# Patient Record
Sex: Female | Born: 1985 | Race: White | Hispanic: No | Marital: Single | Smoking: Former smoker
Health system: Southern US, Community
[De-identification: ages and names within clinical notes are randomized; demographics above are authoritative.]

## PROBLEM LIST (undated history)

## (undated) DIAGNOSIS — N939 Abnormal uterine and vaginal bleeding, unspecified: Secondary | ICD-10-CM

## (undated) DIAGNOSIS — D500 Iron deficiency anemia secondary to blood loss (chronic): Secondary | ICD-10-CM

## (undated) DIAGNOSIS — K589 Irritable bowel syndrome without diarrhea: Secondary | ICD-10-CM

## (undated) DIAGNOSIS — I2699 Other pulmonary embolism without acute cor pulmonale: Secondary | ICD-10-CM

## (undated) DIAGNOSIS — K219 Gastro-esophageal reflux disease without esophagitis: Secondary | ICD-10-CM

## (undated) DIAGNOSIS — D649 Anemia, unspecified: Secondary | ICD-10-CM

## (undated) DIAGNOSIS — E669 Obesity, unspecified: Secondary | ICD-10-CM

## (undated) DIAGNOSIS — F32A Depression, unspecified: Secondary | ICD-10-CM

## (undated) DIAGNOSIS — N926 Irregular menstruation, unspecified: Secondary | ICD-10-CM

## (undated) DIAGNOSIS — K824 Cholesterolosis of gallbladder: Secondary | ICD-10-CM

## (undated) DIAGNOSIS — F419 Anxiety disorder, unspecified: Secondary | ICD-10-CM

## (undated) DIAGNOSIS — E538 Deficiency of other specified B group vitamins: Secondary | ICD-10-CM

## (undated) DIAGNOSIS — Z7901 Long term (current) use of anticoagulants: Secondary | ICD-10-CM

## (undated) DIAGNOSIS — F329 Major depressive disorder, single episode, unspecified: Secondary | ICD-10-CM

## (undated) HISTORY — DX: Anxiety disorder, unspecified: F41.9

## (undated) HISTORY — DX: Cholesterolosis of gallbladder: K82.4

## (undated) HISTORY — DX: Irritable bowel syndrome, unspecified: K58.9

## (undated) HISTORY — PX: NO PAST SURGERIES: SHX2092

## (undated) HISTORY — DX: Major depressive disorder, single episode, unspecified: F32.9

## (undated) HISTORY — DX: Depression, unspecified: F32.A

## (undated) HISTORY — DX: Obesity, unspecified: E66.9

## (undated) HISTORY — DX: Gastro-esophageal reflux disease without esophagitis: K21.9

## (undated) HISTORY — DX: Anemia, unspecified: D64.9

---

## 2007-05-12 ENCOUNTER — Ambulatory Visit: Payer: Self-pay | Admitting: Gastroenterology

## 2007-05-12 LAB — CONVERTED CEMR LAB
Alkaline Phosphatase: 62 units/L (ref 39–117)
BUN: 6 mg/dL (ref 6–23)
Basophils Absolute: 0 10*3/uL (ref 0.0–0.1)
Bilirubin, Direct: 0.1 mg/dL (ref 0.0–0.3)
CO2: 29 meq/L (ref 19–32)
Eosinophils Absolute: 0.1 10*3/uL (ref 0.0–0.6)
GFR calc Af Amer: 161 mL/min
Glucose, Bld: 91 mg/dL (ref 70–99)
HCT: 32 % — ABNORMAL LOW (ref 36.0–46.0)
Iron: 25 ug/dL — ABNORMAL LOW (ref 42–145)
Lymphocytes Relative: 33.6 % (ref 12.0–46.0)
MCHC: 33 g/dL (ref 30.0–36.0)
MCV: 71.6 fL — ABNORMAL LOW (ref 78.0–100.0)
Neutro Abs: 2.3 10*3/uL (ref 1.4–7.7)
Neutrophils Relative %: 55.9 % (ref 43.0–77.0)
Potassium: 3.9 meq/L (ref 3.5–5.1)
RBC: 4.48 M/uL (ref 3.87–5.11)
Saturation Ratios: 5.2 % — ABNORMAL LOW (ref 20.0–50.0)
Sodium: 143 meq/L (ref 135–145)
Total Bilirubin: 0.5 mg/dL (ref 0.3–1.2)
Total Protein: 7.5 g/dL (ref 6.0–8.3)
Transferrin: 345.4 mg/dL (ref >=212.0)

## 2007-06-02 ENCOUNTER — Ambulatory Visit: Payer: Self-pay | Admitting: Gastroenterology

## 2007-08-05 DIAGNOSIS — K219 Gastro-esophageal reflux disease without esophagitis: Secondary | ICD-10-CM

## 2007-08-05 DIAGNOSIS — K589 Irritable bowel syndrome without diarrhea: Secondary | ICD-10-CM

## 2007-08-05 DIAGNOSIS — E669 Obesity, unspecified: Secondary | ICD-10-CM

## 2007-08-05 DIAGNOSIS — F418 Other specified anxiety disorders: Secondary | ICD-10-CM | POA: Insufficient documentation

## 2007-08-05 DIAGNOSIS — F411 Generalized anxiety disorder: Secondary | ICD-10-CM | POA: Insufficient documentation

## 2007-08-05 DIAGNOSIS — K828 Other specified diseases of gallbladder: Secondary | ICD-10-CM

## 2007-08-05 DIAGNOSIS — F329 Major depressive disorder, single episode, unspecified: Secondary | ICD-10-CM

## 2008-06-17 ENCOUNTER — Telehealth: Payer: Self-pay | Admitting: Gastroenterology

## 2008-07-16 ENCOUNTER — Ambulatory Visit: Payer: Self-pay | Admitting: Gastroenterology

## 2008-10-22 ENCOUNTER — Telehealth: Payer: Self-pay | Admitting: Gastroenterology

## 2008-10-23 ENCOUNTER — Encounter: Payer: Self-pay | Admitting: Gastroenterology

## 2009-07-01 ENCOUNTER — Telehealth: Payer: Self-pay | Admitting: Gastroenterology

## 2009-07-15 ENCOUNTER — Ambulatory Visit: Payer: Self-pay | Admitting: Gastroenterology

## 2009-07-29 ENCOUNTER — Ambulatory Visit: Payer: Self-pay | Admitting: Gastroenterology

## 2009-07-29 ENCOUNTER — Encounter (INDEPENDENT_AMBULATORY_CARE_PROVIDER_SITE_OTHER): Payer: Self-pay | Admitting: *Deleted

## 2010-06-11 ENCOUNTER — Ambulatory Visit: Payer: Self-pay | Admitting: Gastroenterology

## 2010-08-11 NOTE — Assessment & Plan Note (Signed)
Summary: YEARLY APPT...LSW.   History of Present Illness Visit Type: Follow-up Visit Primary GI MD: Sheryn Bison MD FACP FAGA Primary Provider: Clarita Leber Requesting Provider: n/a Chief Complaint: yearly- med refills, IBS (stress related) History of Present Illness:   Tiffany English continues with some mild depression symptomatology and is on Lexapro, BuSpar, and p.r.n. Librax for IBS. She is asymptomatic in terms of any reflux symptoms on Prevacid 30 mg a day. Her appetite is good and her weight is stable. She is having regular bowel movements without melena or hematochezia.   GI Review of Systems      Denies abdominal pain, acid reflux, belching, bloating, chest pain, dysphagia with liquids, dysphagia with solids, heartburn, loss of appetite, nausea, vomiting, vomiting blood, weight loss, and  weight gain.      Reports irritable bowel syndrome.     Denies anal fissure, black tarry stools, change in bowel habit, constipation, diarrhea, diverticulosis, fecal incontinence, heme positive stool, hemorrhoids, jaundice, light color stool, liver problems, rectal bleeding, and  rectal pain.    Current Medications (verified): 1)  Clidinium-Chlordiazepoxide 2.5-5 Mg Caps (Clidinium-Chlordiazepoxide) .... Take One By Mouth Three Times A Day 2)  Lexapro 10 Mg Tabs (Escitalopram Oxalate) .... Take 1 Tablet By Mouth Once A Day 3)  Qc Womens Daily Multivitamin  Tabs (Multiple Vitamins-Minerals) .Marland Kitchen.. 1 Once Daily 4)  Lansoprazole 30 Mg Cpdr (Lansoprazole) .Marland Kitchen.. 1 By Mouth Qd 5)  Bupropion Hcl 150 Mg Xr24h-Tab (Bupropion Hcl) .... Take 1 Tablet By Mouth Once Daily  Allergies (verified): No Known Drug Allergies  Past History:  Past medical, surgical, family and social histories (including risk factors) reviewed for relevance to current acute and chronic problems.  Past Medical History: Reviewed history from 08/05/2007 and no changes required. Current Problems:  POLYP, GALLBLADDER  (ICD-575.8) DEPRESSION (ICD-311) ANXIETY (ICD-300.00) GERD (ICD-530.81) COLORECTAL CANCER, FAMILY HX (ICD-V16.0) IRRITABLE BOWEL SYNDROME (ICD-564.1) OBESITY (ICD-278.00)  Past Surgical History: Reviewed history from 08/05/2007 and no changes required. Unremarkable  Family History: Reviewed history from 07/29/2009 and no changes required. Family History of Colon Cancer:grandfather,uncle Family History of Prostate Cancer:prostate uncle Family History of Liver Disease/Cirrhosis:great grandmother  Social History: Reviewed history from 07/29/2009 and no changes required. Patient has never smoked.  Alcohol Use - no Illicit Drug Use - no Daily Caffeine Use sodas and coffee Occupation: Student  Review of Systems       The patient complains of anxiety-new, depression-new, and sleeping problems.  The patient denies allergy/sinus, anemia, arthritis/joint pain, back pain, blood in urine, breast changes/lumps, change in vision, confusion, cough, coughing up blood, fainting, fatigue, fever, headaches-new, hearing problems, heart murmur, heart rhythm changes, itching, menstrual pain, muscle pains/cramps, night sweats, nosebleeds, pregnancy symptoms, shortness of breath, skin rash, sore throat, swelling of feet/legs, swollen lymph glands, thirst - excessive , urination - excessive , urination changes/pain, urine leakage, vision changes, and voice change.    Vital Signs:  Patient profile:   25 year old female Height:      74 inches Weight:      208.50 pounds BMI:     26.87 Pulse rate:   72 / minute Pulse rhythm:   regular BP sitting:   120 / 78  (left arm) Cuff size:   regular  Vitals Entered By: June McMurray CMA Duncan Dull) (June 11, 2010 10:51 AM)  Physical Exam  General:  Well developed, well nourished, no acute distress.healthy appearing.   Head:  Normocephalic and atraumatic. Eyes:  PERRLA, no icterus.exam deferred to patient's ophthalmologist.  Lungs:  Clear throughout to  auscultation. Heart:  Regular rate and rhythm; no murmurs, rubs,  or bruits. Abdomen:  Soft, nontender and nondistended. No masses, hepatosplenomegaly or hernias noted. Normal bowel sounds. Extremities:  No clubbing, cyanosis, edema or deformities noted. Neurologic:  Alert and  oriented x4;  grossly normal neurologically. Psych:  Alert and cooperative. Normal mood and affect.   Impression & Recommendations:  Problem # 1:  GERD (ICD-530.81) Assessment Improved Continue antireflux maneuvers and daily Prevacid therapy.  Problem # 2:  DEPRESSION (ICD-311) Assessment: Improved continue antidepressant medication per primary care  Problem # 3:  COLORECTAL CANCER, FAMILY HX (ICD-V16.0) Assessment: Unchanged colonoscopy as per clinical protocol.Her family history is not warrant colonoscopy at this time. I would probably recommend screening at age 13.  Patient Instructions: 1)  Copy sent to : Thea Gist, MD 2)  Your prescription(s) have been sent to you pharmacy.  3)  The medication list was reviewed and reconciled.  All changed / newly prescribed medications were explained.  A complete medication list was provided to the patient / caregiver. 4)  Please schedule a follow-up appointment in 1 year. 5)  Avoid foods high in acid content ( tomatoes, citrus juices, spicy foods) . Avoid eating within 3 to 4 hours of lying down or before exercising. Do not over eat; try smaller more frequent meals. Elevate head of bed four inches when sleeping.  6)  GI Reflux and Hiatal Hernia brochure given.  7)  IBS brochure given.  Prescriptions: LANSOPRAZOLE 30 MG CPDR (LANSOPRAZOLE) 1 by mouth qd  #30 x 11   Entered by:   Harlow Mares CMA (AAMA)   Authorized by:   Mardella Layman MD [facility]   Signed by:   Harlow Mares CMA (AAMA) on 06/11/2010   Method used:   Electronically to        [id number] (retail)       [address]       [place], [place]  [address]       Ph: [phone] or [phone]        Fax: [phone]   RxID:   [id number] CLIDINIUM-CHLORDIAZEPOXIDE 2.5-5 MG CAPS (CLIDINIUM-CHLORDIAZEPOXIDE) take one by mouth three times a day  #90 Capsule x 3   Entered by:   Harlow Mares CMA (AAMA)   Authorized by:   Mardella Layman MD [facility]   Signed by:   Harlow Mares CMA (AAMA) on 06/11/2010   Method used:   Electronically to        [id number] (retail)       [address]       [place], [place]  [address]       Ph: [phone] or [phone]       Fax: [phone]   RxID:   [id number]

## 2010-08-11 NOTE — Letter (Signed)
Summary: [facility] Instructions  [facility]  [address] [place], [place] [address]   Phone: [phone]  Fax: [phone]       Tiffany English    August 16, 1985    MRN: [id number]        Procedure Day /Date: Friday, 08/15/09     Arrival Time: 1:30      Procedure Time: 2:30     Location of Procedure:                    Tiffany English  [facility] (4th Floor)                        PREPARATION FOR COLONOSCOPY WITH MOVIPREP   Starting 5 days prior to your procedure 08/10/09 do not eat nuts, seeds, popcorn, corn, beans, peas,  salads, or any raw vegetables.  Do not take any fiber supplements (e.g. Metamucil, Citrucel, and Benefiber).  THE DAY BEFORE YOUR PROCEDURE         DATE: 08/14/09  DAY: Thursday  1.  Drink clear liquids the entire day-NO SOLID FOOD  2.  Do not drink anything colored red or purple.  Avoid juices with pulp.  No orange juice.  3.  Drink at least 64 oz. (8 glasses) of fluid/clear liquids during the day to prevent dehydration and help the prep work efficiently.  CLEAR LIQUIDS INCLUDE: Water Jello Ice Popsicles Tea (sugar ok, no milk/cream) Powdered fruit flavored drinks Coffee (sugar ok, no milk/cream) Gatorade Juice: apple, white grape, white cranberry  Lemonade Clear bullion, consomm, broth Carbonated beverages (any kind) Strained chicken noodle soup Hard Candy                             4.  In the morning, mix first dose of MoviPrep solution:    Empty 1 Pouch A and 1 Pouch B into the disposable container    Add lukewarm drinking water to the top line of the container. Mix to dissolve    Refrigerate (mixed solution should be used within 24 hrs)  5.  Begin drinking the prep at 5:00 p.m. The MoviPrep container is divided by 4 marks.   Every 15 minutes drink the solution down to the next mark (approximately 8 oz) until the full liter is complete.   6.  Follow completed prep with 16 oz of clear liquid of your choice (Nothing red  or purple).  Continue to drink clear liquids until bedtime.  7.  Before going to bed, mix second dose of MoviPrep solution:    Empty 1 Pouch A and 1 Pouch B into the disposable container    Add lukewarm drinking water to the top line of the container. Mix to dissolve    Refrigerate  THE DAY OF YOUR PROCEDURE      DATE: 08/15/09  DAY: Friday  Beginning at 9:30 a.m. (5 hours before procedure):         1. Every 15 minutes, drink the solution down to the next mark (approx 8 oz) until the full liter is complete.  2. Follow completed prep with 16 oz. of clear liquid of your choice.    3. You may drink clear liquids until 12:30 (2 HOURS BEFORE PROCEDURE).   MEDICATION INSTRUCTIONS  Unless otherwise instructed, you should take regular prescription medications with a small sip of water   as early as possible the morning of your  procedure.               OTHER INSTRUCTIONS  You will need a responsible adult at least 26 years of age to accompany you and drive you home.   This person must remain in the waiting room during your procedure.  Wear loose fitting clothing that is easily removed.  Leave jewelry and other valuables at home.  However, you may wish to bring a book to read or  an iPod/MP3 player to listen to music as you wait for your procedure to start.  Remove all body piercing jewelry and leave at home.  Total time from sign-in until discharge is approximately 2-3 hours.  You should go home directly after your procedure and rest.  You can resume normal activities the  day after your procedure.  The day of your procedure you should not:   Drive   Make legal decisions   Operate machinery   Drink alcohol   Return to work  You will receive specific instructions about eating, activities and medications before you leave.    The above instructions have been reviewed and explained to me by   _______________________    I fully understand and can verbalize  these instructions _____________________________ Date _________

## 2010-08-11 NOTE — Assessment & Plan Note (Signed)
Summary: RES FROM 07-15-09/YF   History of Present Illness Visit Type: Follow-up Visit Primary GI MD: Sheryn Bison MD FACP FAGA Primary Provider: Clarita Leber Requesting Provider: n/a Chief Complaint: Discuss colon screening due to family hx & med refill - chlordiazepoxide History of Present Illness:   Tiffany English is asymptomatic on regular Prevacid. She denies reflux symptoms or dysphagia. She has IBS management p.r.n. Librax. She does have chronic depression and takes Lexapro 10 mg a day and BuSpar p.r.n. She has a strong family history colon cancer with her mother having polyps at age 20, her uncle with colon cancer at age 48, and her maternal grandfather having colon cancer. She herself denies rectal bleeding or significant abdominal pain at this time. Her appetite is good her weight is stable. She denies a systemic complaints.   GI Review of Systems      Denies abdominal pain, acid reflux, belching, bloating, chest pain, dysphagia with liquids, dysphagia with solids, heartburn, loss of appetite, nausea, vomiting, vomiting blood, weight loss, and  weight gain.        Denies anal fissure, black tarry stools, change in bowel habit, constipation, diarrhea, diverticulosis, fecal incontinence, heme positive stool, hemorrhoids, irritable bowel syndrome, jaundice, light color stool, liver problems, rectal bleeding, and  rectal pain.    Current Medications (verified): 1)  Clidinium-Chlordiazepoxide 2.5-5 Mg Caps (Clidinium-Chlordiazepoxide) .... Take One By Mouth Three Times A Day 2)  Lexapro 10 Mg Tabs (Escitalopram Oxalate) .... Take 1 Tablet By Mouth Once A Day 3)  Qc Womens Daily Multivitamin  Tabs (Multiple Vitamins-Minerals) .Marland Kitchen.. 1 Once Daily 4)  Lansoprazole 30 Mg Cpdr (Lansoprazole) .Marland Kitchen.. 1 By Mouth Qd 5)  Buspirone Hcl 10 Mg Tabs (Buspirone Hcl) .... Take 1/2 Tablet By Mouth Daily  Allergies (verified): No Known Drug Allergies  Past History:  Past medical, surgical, family and  social histories (including risk factors) reviewed for relevance to current acute and chronic problems.  Past Medical History: Reviewed history from 08/05/2007 and no changes required. Current Problems:  POLYP, GALLBLADDER (ICD-575.8) DEPRESSION (ICD-311) ANXIETY (ICD-300.00) GERD (ICD-530.81) COLORECTAL CANCER, FAMILY HX (ICD-V16.0) IRRITABLE BOWEL SYNDROME (ICD-564.1) OBESITY (ICD-278.00)  Past Surgical History: Reviewed history from 08/05/2007 and no changes required. Unremarkable  Family History: Reviewed history from 07/16/2008 and no changes required. Family History of Colon Cancer:grandfather,uncle Family History of Prostate Cancer:prostate uncle Family History of Liver Disease/Cirrhosis:great grandmother  Social History: Reviewed history from 07/16/2008 and no changes required. Patient has never smoked.  Alcohol Use - no Illicit Drug Use - no Daily Caffeine Use sodas and coffee Occupation: Student  Review of Systems       The patient complains of allergy/sinus, anxiety-new, depression-new, and menstrual pain.  The patient denies arthritis/joint pain, back pain, blood in urine, breast changes/lumps, change in vision, confusion, cough, coughing up blood, fainting, fatigue, fever, headaches-new, hearing problems, heart murmur, heart rhythm changes, itching, muscle pains/cramps, night sweats, nosebleeds, pregnancy symptoms, shortness of breath, skin rash, sleeping problems, sore throat, swelling of feet/legs, swollen lymph glands, thirst - excessive , urination - excessive , urination changes/pain, urine leakage, vision changes, and voice change.    Vital Signs:  Patient profile:   25 year old female Height:      74 inches Weight:      191 pounds BMI:     24.61 Pulse rate:   60 / minute Pulse rhythm:   regular BP sitting:   114 / 68  (left arm) Cuff size:   regular  Vitals Entered By: June  McMurray CMA Duncan Dull) (July 29, 2009 3:35 PM)  Physical Exam  General:   Well developed, well nourished, no acute distress.healthy appearing.   Head:  Normocephalic and atraumatic. Eyes:  PERRLA, no icterus.exam deferred to patient's ophthalmologist.   Abdomen:  Soft, nontender and nondistended. No masses, hepatosplenomegaly or hernias noted. Normal bowel sounds. Extremities:  No clubbing, cyanosis, edema or deformities noted. Neurologic:  Alert and  oriented x4;  grossly normal neurologically. Psych:  Alert and cooperative. Normal mood and affect.   Impression & Recommendations:  Problem # 1:  COLORECTAL CANCER, FAMILY HX (ICD-V16.0) Assessment Unchanged  Outpatient colonoscopy scheduled with genetic analysis if indicated  Orders: Colonoscopy (Colon)  Problem # 2:  GERD (ICD-530.81) Assessment: Improved Continue reflux regime and daily PPI therapy.  Problem # 3:  DEPRESSION (ICD-311) Assessment: Improved Continue Lexapro 10 mg a day.  Patient Instructions: 1)  Copy sent to : Dr. Thea Gist 2)  Please continue current medications.  3)  Avoid foods high in acid content ( tomatoes, citrus juices, spicy foods) . Avoid eating within 3 to 4 hours of lying down or before exercising. Do not over eat; try smaller more frequent meals. Elevate head of bed four inches when sleeping.  4)  Colonoscopy and Flexible Sigmoidoscopy brochure given.  5)  Conscious Sedation brochure given.  6)  Daily Prevacid 30 mg and p.r.n. Librax. 7)  The medication list was reviewed and reconciled.  All changed / newly prescribed medications were explained.  A complete medication list was provided to the patient / caregiver. Prescriptions: MOVIPREP 100 GM  SOLR (PEG-KCL-NACL-NASULF-NA ASC-C) As per prep instructions.  #1 x 0   Entered by:   Ashok Cordia RN   Authorized by:   Mardella Layman MD [facility]   Signed by:   Ashok Cordia RN on 07/29/2009   Method used:   Historical   RxID:   [id number]

## 2010-11-24 NOTE — Assessment & Plan Note (Signed)
[facility]                         GASTROENTEROLOGY OFFICE NOTE   NAME:Tiffany English, Tiffany English                       MRN:          [id number]  DATE:05/12/2007                            DOB:          July 29, 1985    Tiffany English is an extremely pleasant 25 year old white female referred  by Dr. Cleda Clarks for evaluation of abdominal pain, gas, bloating, and  reflux symptoms.   HISTORY OF PRESENT ILLNESS:  Tiffany English has been in good health all of her  life, but has been morbidly obese for years, and over the last 3 years  has lost about over 150 pounds in weight with conscious diet, attention  to her Weight Watchers plan.  She has a long history of diarrhea  predominant irritable bowel syndrome with gas and bloating, and episodes  of acid reflux with burning substernal chest pain and regurgitation.  Her symptoms are managed by daily Prevacid and p.r.n. hyoscyamine.  She  has had no hepatobiliary complaints, melena, hematochezia, or other  significant gastrointestinal problems.  She denies history of hepatitis  or pancreatitis.  She has no associated dysphagia, food intolerances, or  family history of known gastrointestinal problems.  She did have an  ultrasound done recently, which showed a gallbladder polyp, but  otherwise was unremarkable, and there was no evidence of fatty liver.  She, herself, relates all of her episodes from stress at work, and she  is asymptomatic when she is away from work and on the weekends.   FAMILY HISTORY:  Remarkable for her grandfather dying his late 19s of  colon cancer, and her mother apparently has had colon polyps.  Another  grandfather had prostate cancer.  One grandmother had breast cancer.  There is atherosclerosis and diabetes in her family.   MEDICATIONS:  1. Prevacid 10 mg daily.  2. Lexapro 10 mg a day.  3. Hyoscyamine 0.125 mg p.r.n.   PAST MEDICAL HISTORY:  She has had anxiety and depression for the last 7  to 8 years.   She denies other general medical problems.   SOCIAL HISTORY:  She is single and lives with her mother.  She has a  college degree and works as a Company secretary at [employer].  She does not  smoke or use ethanol socially.  She denies problems with ethanol abuse.   REVIEW OF SYSTEMS:  Otherwise noncontributory, except for what appears  to be worsening anxiety and depression despite taking Lexapro.  She  denies suicidal thoughts, delusions, or any symptoms of psychosis.  She  specifically denies any cardiovascular, pulmonary, genitourinary,  gynecologic, endocrine, or orthopedic problems.  She is in her menstrual  period at this time.   She is an attractive, healthy-appearing white female in no distress.  She is 6 feet 2 inches tall, and weighs 204 pounds.  Blood pressure  122/80.  Pulse was 86 and regular.  I could not appreciate stigmata of chronic liver disease.  There was no thyromegaly or lymphadenopathy noted.  LUNGS:  Clear to percussion and auscultation.  She was at a regular rhythm without murmurs, gallops, or rubs.  I could not  appreciate organomegaly, abdominal masses, tenderness, or  distention.  Bowel sounds were normal.  Inspection of the rectum was unremarkable, as was rectal exam.  Stool  was guaiac negative.  Peripheral extremities were unremarkable.  Mental status was clear.   ASSESSMENT:  1. Rather classic diarrhea prominent irritable bowel syndrome.  2. History of acid reflux, doing well on Prevacid therapy.  3. Asymptomatic gallbladder polyp.  4. Family history of colon cancer and colonic polyposis.  5. History of anxiety and depression causing worsening of her      irritable bowel syndrome.   RECOMMENDATIONS:  1. Repeat laboratory data including liver function tests and sprue      panel.  2. Reflux with daily Prevacid 30 mg before breakfast.  3. Trial of Librax 1 p.o. t.i.d. before meals.  4. Low gas diet along with avoidance of sorbitol and fructose.  5. GI  followup in several weeks' time.  We will how she is doing      symptomatically, and decide if further workup is indicated.     Tiffany English. Jarold Motto, MD, Caleen Essex, FAGA  Electronically Signed    DRP/MedQ  DD: 05/12/2007  DT: 05/12/2007  Job #: [id number]   cc:   Theadora Rama, M.D.

## 2011-05-11 ENCOUNTER — Other Ambulatory Visit: Payer: Self-pay | Admitting: Gastroenterology

## 2011-05-27 ENCOUNTER — Encounter: Payer: Self-pay | Admitting: *Deleted

## 2011-05-30 ENCOUNTER — Other Ambulatory Visit: Payer: Self-pay | Admitting: Gastroenterology

## 2011-05-31 ENCOUNTER — Ambulatory Visit: Payer: Self-pay | Admitting: Gastroenterology

## 2011-07-04 ENCOUNTER — Other Ambulatory Visit: Payer: Self-pay | Admitting: Gastroenterology

## 2011-07-05 NOTE — Telephone Encounter (Signed)
NEEDS OFFICE VISIT FOR ANY FURTHER REFILLS! 

## 2011-09-07 ENCOUNTER — Other Ambulatory Visit: Payer: Self-pay | Admitting: Gastroenterology

## 2011-09-07 DIAGNOSIS — K824 Cholesterolosis of gallbladder: Secondary | ICD-10-CM

## 2011-09-07 DIAGNOSIS — R1011 Right upper quadrant pain: Secondary | ICD-10-CM

## 2011-09-08 ENCOUNTER — Other Ambulatory Visit: Payer: Self-pay

## 2011-09-13 ENCOUNTER — Inpatient Hospital Stay: Admission: RE | Admit: 2011-09-13 | Payer: Self-pay | Source: Ambulatory Visit

## 2011-09-22 ENCOUNTER — Ambulatory Visit
Admission: RE | Admit: 2011-09-22 | Discharge: 2011-09-22 | Disposition: A | Discharge status: Home / Self Care | Payer: BC Managed Care – PPO | Source: Ambulatory Visit | Attending: Gastroenterology | Admitting: Gastroenterology

## 2011-09-22 DIAGNOSIS — K824 Cholesterolosis of gallbladder: Secondary | ICD-10-CM

## 2011-09-22 DIAGNOSIS — R1011 Right upper quadrant pain: Secondary | ICD-10-CM

## 2015-07-13 HISTORY — PX: WISDOM TOOTH EXTRACTION: SHX21

## 2016-07-12 HISTORY — PX: COLONOSCOPY WITH ESOPHAGOGASTRODUODENOSCOPY (EGD): SHX5779

## 2017-04-29 ENCOUNTER — Emergency Department (HOSPITAL_COMMUNITY)
Admission: EM | Admit: 2017-04-29 | Discharge: 2017-04-30 | Disposition: A | Discharge status: Home / Self Care | Payer: Self-pay | Attending: Emergency Medicine | Admitting: Emergency Medicine

## 2017-04-29 ENCOUNTER — Encounter (HOSPITAL_COMMUNITY): Payer: Self-pay

## 2017-04-29 ENCOUNTER — Emergency Department (HOSPITAL_COMMUNITY): Payer: Self-pay

## 2017-04-29 DIAGNOSIS — W010XXA Fall on same level from slipping, tripping and stumbling without subsequent striking against object, initial encounter: Secondary | ICD-10-CM

## 2017-04-29 DIAGNOSIS — W01190A Fall on same level from slipping, tripping and stumbling with subsequent striking against furniture, initial encounter: Secondary | ICD-10-CM | POA: Insufficient documentation

## 2017-04-29 DIAGNOSIS — Y999 Unspecified external cause status: Secondary | ICD-10-CM | POA: Insufficient documentation

## 2017-04-29 DIAGNOSIS — Y92009 Unspecified place in unspecified non-institutional (private) residence as the place of occurrence of the external cause: Secondary | ICD-10-CM | POA: Insufficient documentation

## 2017-04-29 DIAGNOSIS — S01412A Laceration without foreign body of left cheek and temporomandibular area, initial encounter: Secondary | ICD-10-CM | POA: Insufficient documentation

## 2017-04-29 DIAGNOSIS — Y939 Activity, unspecified: Secondary | ICD-10-CM | POA: Insufficient documentation

## 2017-04-29 DIAGNOSIS — S0121XA Laceration without foreign body of nose, initial encounter: Secondary | ICD-10-CM | POA: Insufficient documentation

## 2017-04-29 MED ORDER — CEFAZOLIN SODIUM 1 G IJ SOLR
1.0000 g | Freq: Once | INTRAMUSCULAR | Status: AC
  Administered 2017-04-29: 1 g via INTRAMUSCULAR
  Filled 2017-04-29: qty 10

## 2017-04-29 MED ORDER — LIDOCAINE-EPINEPHRINE (PF) 2 %-1:200000 IJ SOLN
10.0000 mL | Freq: Once | INTRAMUSCULAR | Status: AC
  Administered 2017-04-29: 10 mL
  Filled 2017-04-29: qty 20

## 2017-04-29 MED ORDER — STERILE WATER FOR INJECTION IJ SOLN
INTRAMUSCULAR | Status: AC
  Administered 2017-04-29: 2.1 mL
  Filled 2017-04-29: qty 10

## 2017-04-29 NOTE — ED Provider Notes (Signed)
[facility] Provider Note   CSN: [id number] Arrival date & time: 04/29/17  2220     History   Chief Complaint Chief Complaint  Patient presents with  . Fall  . Facial Laceration    HPI Tiffany English is a 31 y.o. female.  The history is provided by the patient.  She tripped and fell and injured her nose on the corner of an entertainment cabinet.  She suffered a laceration.  She denies loss of consciousness and denies other injury.  Last tetanus immunization was 5 years ago.  Past Medical History:  Diagnosis Date  . Anxiety   . Depression   . Gallbladder polyp   . GERD (gastroesophageal reflux disease)   . IBS (irritable bowel syndrome)   . Obesity     Patient Active Problem List   Diagnosis Date Noted  . OBESITY 08/05/2007  . ANXIETY 08/05/2007  . DEPRESSION 08/05/2007  . GERD 08/05/2007  . IRRITABLE BOWEL SYNDROME 08/05/2007  . POLYP, GALLBLADDER 08/05/2007    History reviewed. No pertinent surgical history.  OB History    No data available       Home Medications    Prior to Admission medications   Medication Sig Start Date End Date Taking? Authorizing Provider  clidinium-chlordiazePOXIDE (LIBRAX) 2.5-5 MG per capsule TAKE ONE BY MOUTH THREE TIMES A DAY 05/11/11   Mardella LaymanPatterson, Jalessa Peyser R, MD  lansoprazole (PREVACID) 30 MG capsule TAKE ONE CAPSULE BY MOUTH EVERY DAY 07/04/11   Mardella LaymanPatterson, Derotha Fishbaugh R, MD    Family History Family History  Problem Relation Age of Onset  . Colon cancer Unknown        uncle  . Colon cancer Unknown        grandfather  . Prostate cancer Unknown        uncle  . Liver disease Other        great grandmother    Social History Social History  Substance Use Topics  . Smoking status: Never Smoker  . Smokeless tobacco: Not on file  . Alcohol use Yes     Allergies   Patient has no allergy information on record.   Review of Systems Review of Systems  All other systems reviewed and  are negative.    Physical Exam Updated Vital Signs BP 123/77 (BP Location: Left Arm)   Pulse (!) 115   Temp 98.2 F (36.8 C) (Oral)   Resp 16   LMP 04/26/2017 (Exact Date)   SpO2 98%   Physical Exam  Nursing note and vitals reviewed.  31 year old female, resting comfortably and in no acute distress. Vital signs are significant for tachycardia. Oxygen saturation is 98%, which is normal. Head is normocephalic.  Laceration is present on the left side of the bridge of the nose, and in the left infraorbital area. PERRLA, EOMI. Oropharynx is clear. Neck is nontender and supple without adenopathy or JVD. Back is nontender and there is no CVA tenderness. Lungs are clear without rales, wheezes, or rhonchi. Chest is nontender. Heart has regular rate and rhythm without murmur. Abdomen is soft, flat, nontender without masses or hepatosplenomegaly and peristalsis is normoactive. Extremities have no cyanosis or edema, full range of motion is present. Skin is warm and dry without rash. Neurologic: Mental status is normal, cranial nerves are intact, there are no motor or sensory deficits.  ED Treatments / Results   Radiology Ct Maxillofacial Wo Contrast  Result Date: 04/29/2017 CLINICAL DATA:  Larey SeatFell and hit face  on television stand EXAM: CT MAXILLOFACIAL WITHOUT CONTRAST TECHNIQUE: Multidetector CT imaging of the maxillofacial structures was performed. Multiplanar CT image reconstructions were also generated. COMPARISON:  None. FINDINGS: Osseous: Mandibular heads are normally positioned. No mandibular fracture. Image mastoid air cells are clear. Pterygoid plates and zygomatic arches are intact. No acute nasal bone fracture Orbits: Negative. No traumatic or inflammatory finding. Sinuses: No acute fluid levels. Mild mucosal thickening in the sphenoid and ethmoid sinuses. No sinus wall fracture. Soft tissues: Soft tissue laceration in the left infraorbital region and nasal area. Limited intracranial:  No significant or unexpected finding. IMPRESSION: 1. No acute facial bone fracture is seen 2. Soft tissue lacerations over the nasal area and left infraorbital region. Electronically Signed   By: Jasmine Pang M.D.   On: 04/29/2017 23:29    Procedures Procedures (including critical care time) LACERATION REPAIR Performed by: ZOXWR,UEAVW Authorized by: [email or web address],BJYNW Consent: Verbal consent obtained. Risks and benefits: risks, benefits and alternatives were discussed Consent given by: patient Patient identity confirmed: provided demographic data Prepped and Draped in normal sterile fashion Wound explored  Laceration Location: Nose  Laceration Length: 2.5 cm  No Foreign Bodies seen or palpated  Anesthesia: local infiltration  Local anesthetic: lidocaine 2% with epinephrine  Anesthetic total: 2 ml  Amount of cleaning: standard  Skin closure: close  Number of sutures: 5  Technique: Simple interrupted sutures with 5-0 Prolene  Patient tolerance: Patient tolerated the procedure well with no immediate complications.   LACERATION REPAIR Performed by: GNFAO,ZHYQM Authorized by: VHQIO,NGEXB Consent: Verbal consent obtained. Risks and benefits: risks, benefits and alternatives were discussed Consent given by: patient Patient identity confirmed: provided demographic data Prepped and Draped in normal sterile fashion Wound explored  Laceration Location: Left cheek  Laceration Length: 1.5 cm  No Foreign Bodies seen or palpated  Anesthesia: local infiltration  Local anesthetic: lidocaine 2% with epinephrine  Anesthetic total: 1.5 ml  Amount of cleaning: standard  Skin closure: close  Number of sutures: 2  Technique: Simple interrupted sutures with 5-0 Prolene  Patient tolerance: Patient tolerated the procedure well with no immediate complications.   Medications Ordered in ED Medications  lidocaine-EPINEPHrine (XYLOCAINE W/EPI) 2 %-1:200000 (PF) injection 10 mL  (not administered)  ceFAZolin (ANCEF) injection 1 g (1 g Intramuscular Given 04/29/17 2255)  sterile water (preservative free) injection (2.1 mLs  Given 04/29/17 2255)     Initial Impression / Assessment and Plan / ED Course  I have reviewed the triage vital signs and the nursing notes.  Pertinent imaging results that were available during my care of the patient were reviewed by me and considered in my medical decision making (see chart for details).  Fall with laceration of nose and cheek.  CT shows no evidence of fracture.  Lacerations are closed in the ED.  Final Clinical Impressions(s) / ED Diagnoses   Final diagnoses:  Fall from slip, trip, or stumble, initial encounter  Laceration of nose, initial encounter  Laceration of left cheek, initial encounter    New Prescriptions New Prescriptions   No medications on file     Dione Booze, MD 04/30/17 [phone]

## 2017-04-29 NOTE — ED Triage Notes (Signed)
Pt arrives to ed due to fall at home; pt states she tripped and hit face on TV stand; Pt presents with a chunk of bridge of nose missing; pt blew her nose in triage and nose started to bleed; bleeding has slowed at triage; EDP notified and CT; pt denies any pain on arrival. Wound covered with bacitracin wet to dry gauze per EDP-Monique,RN

## 2017-04-29 NOTE — ED Provider Notes (Signed)
I was called regarding orders for likely open deep laceration and tissue deficit of nasal injury. Patient protecting her airway and is hemodynamically stable. Bleeding is controlled. CT face ordered and dose of Ancef ordered. Patient has no allergies or other medical problems. Ancef given for possible open fracture prophylaxis.   Tiffany English, Tiffany Wicke, MD 04/29/17 2245

## 2017-04-29 NOTE — ED Notes (Signed)
Pt ambulated with steady gait to the restroom. Denies dizziness.

## 2017-04-30 NOTE — ED Notes (Signed)
Pt verbalized understanding of d/c instructions and has no further questions. Pt is stable, A&Ox4, VSS.  

## 2017-04-30 NOTE — Discharge Instructions (Signed)
Stitched need to be removed in 4-5 days. That can be done here, or at an urgent care center. Leaving the stitches in for longer than five days leads to more scarring.

## 2019-01-15 IMAGING — CT CT MAXILLOFACIAL W/O CM
3 series · 16 of 47 positions shown, 19 images · non-contrast
Comparison: None.

CLINICAL DATA: Fell and hit face on television stand

EXAM:
CT MAXILLOFACIAL WITHOUT CONTRAST
TECHNIQUE: Multidetector CT imaging of the maxillofacial structures was
performed. Multiplanar CT image reconstructions were also generated.

[Series 3: facialbone 2.0 st · axial · 0.30mm/px · z∈[-152,-14]mm · 10 of 81 slices shown, 13 images]
[im 6/81  brain]
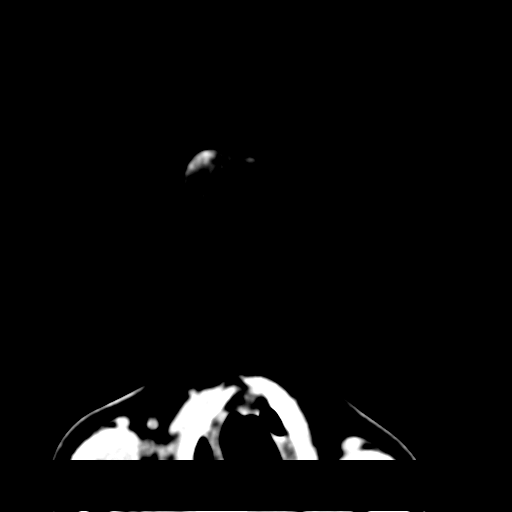
[im 6/81  bone]
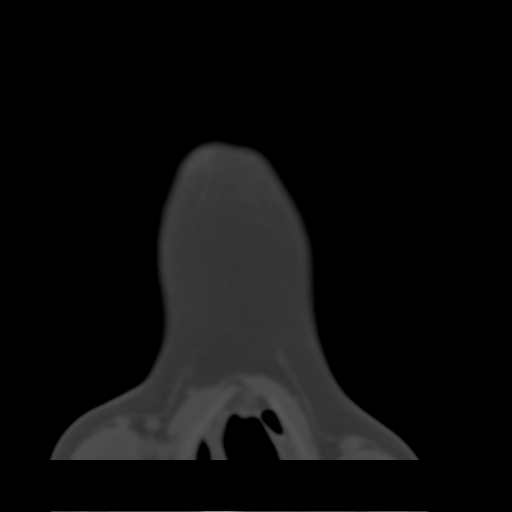
[im 14/81  bone]
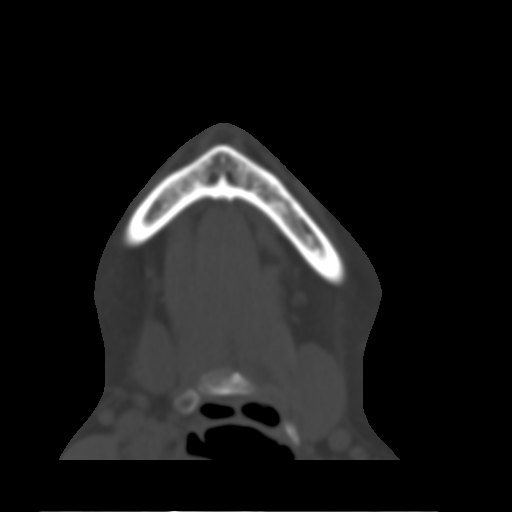
[im 23/81  bone]
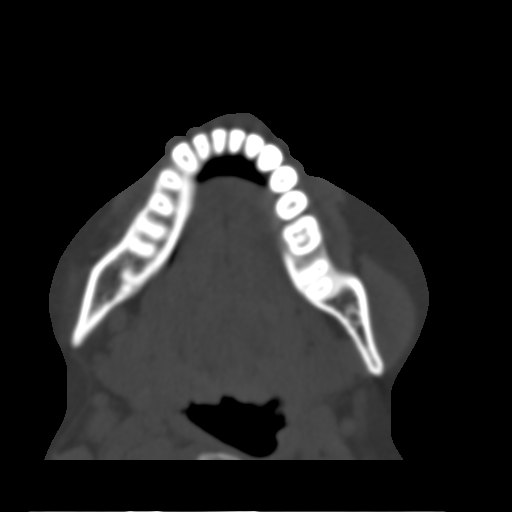
[im 28/81  bone]
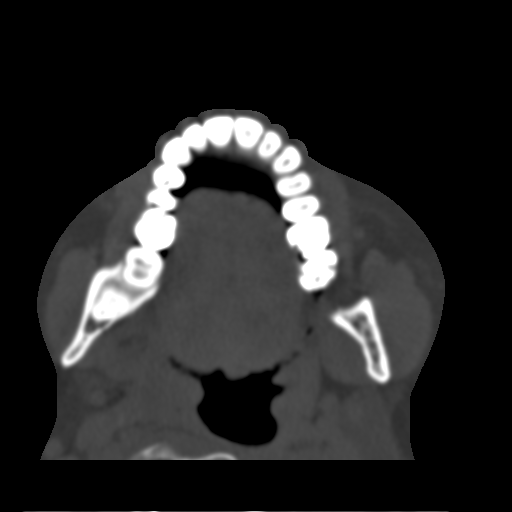
[im 36/81  brain]
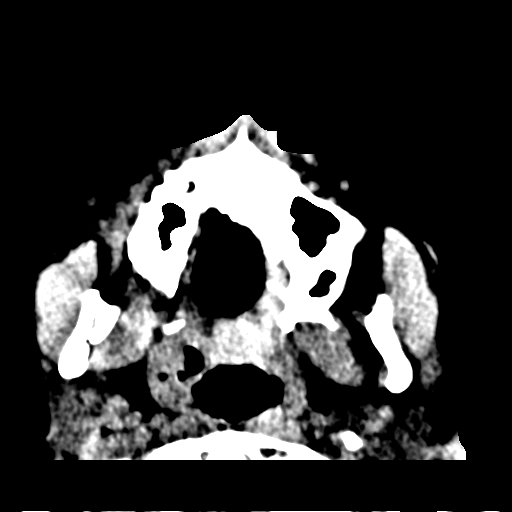
[im 36/81  bone]
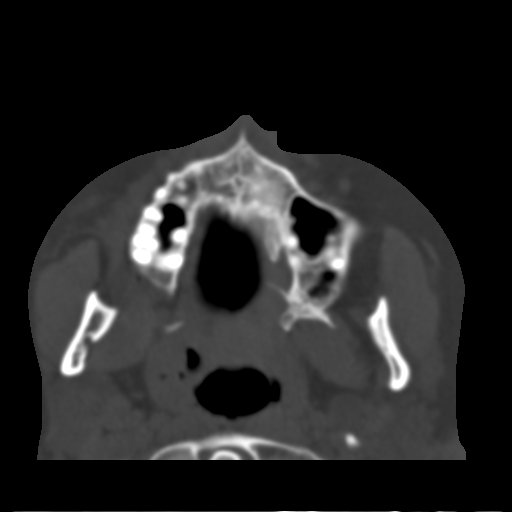
[im 45/81  bone]
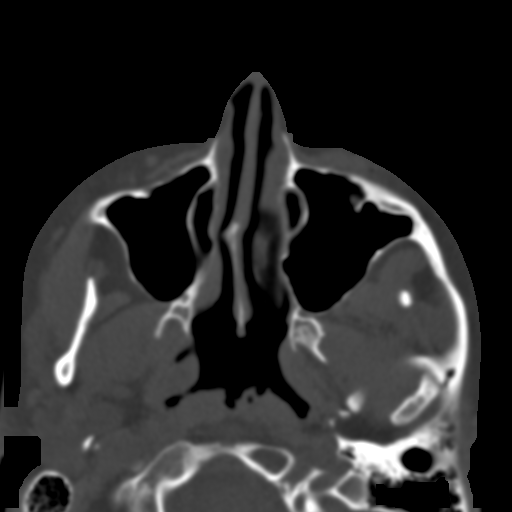
[im 53/81  bone]
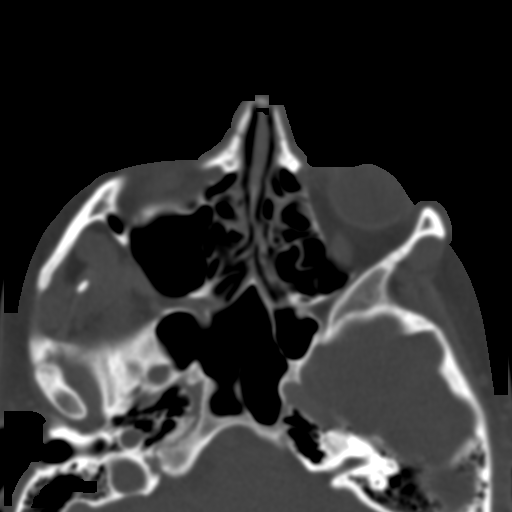
[im 61/81  bone]
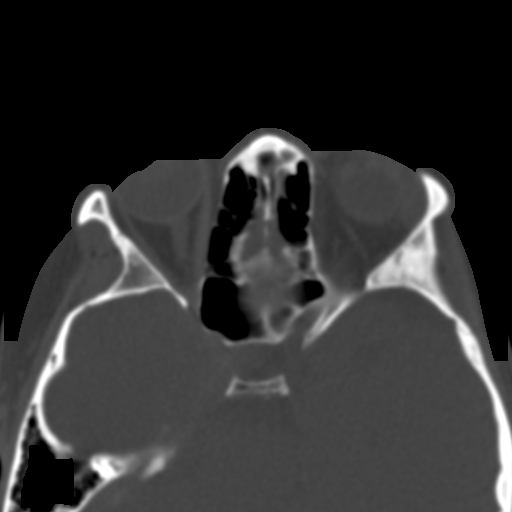
[im 67/81  brain]
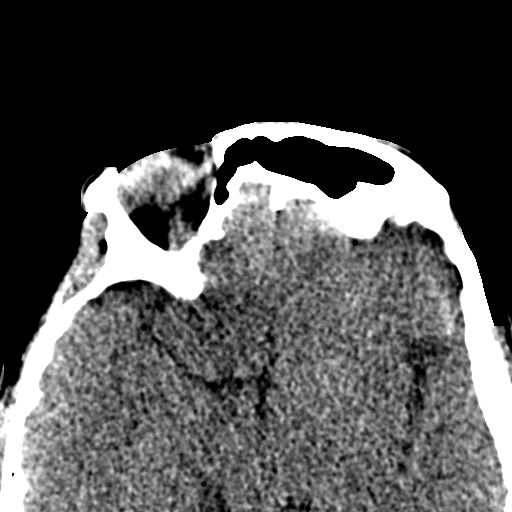
[im 67/81  bone]
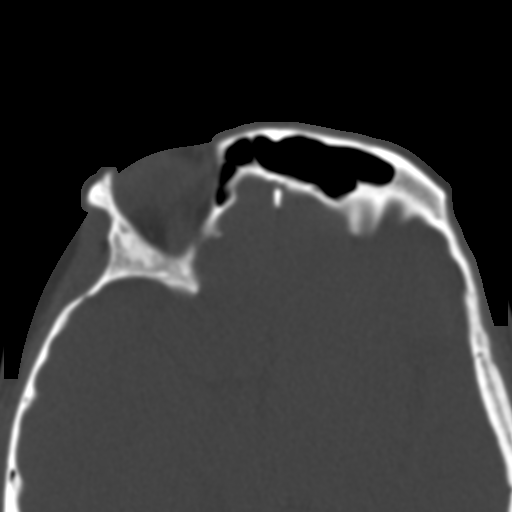
[im 75/81  bone]
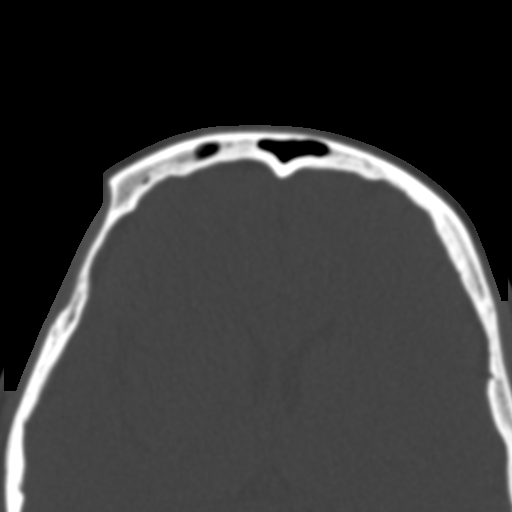

[Series 7: facialbone 2.0 cor st · coronal · 0.31mm/px · 3 of 76 slices shown]
[im 26/76  bone]
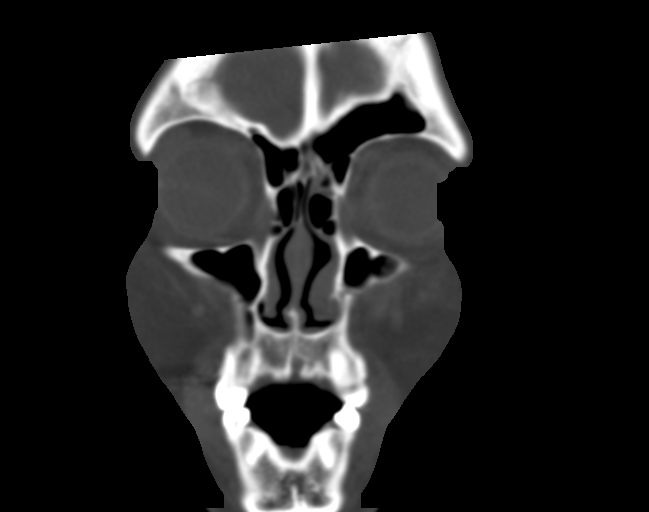
[im 34/76  bone]
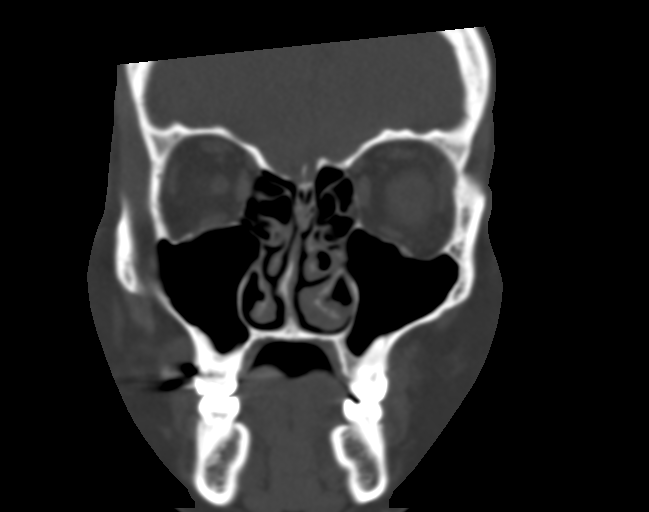
[im 42/76  bone]
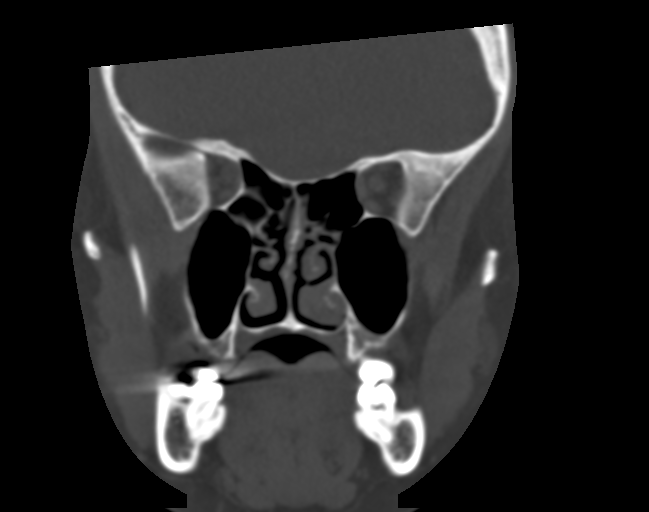

[Series 8: facialbone 2.0 sag st · sagittal · 0.30mm/px · 3 of 82 slices shown]
[im 30/82  bone]
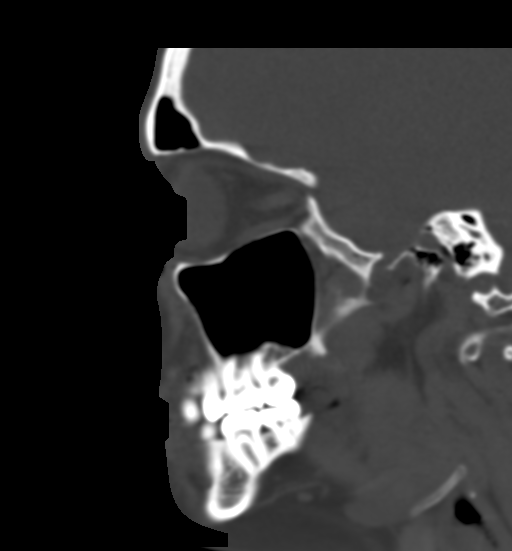
[im 41/82  bone]
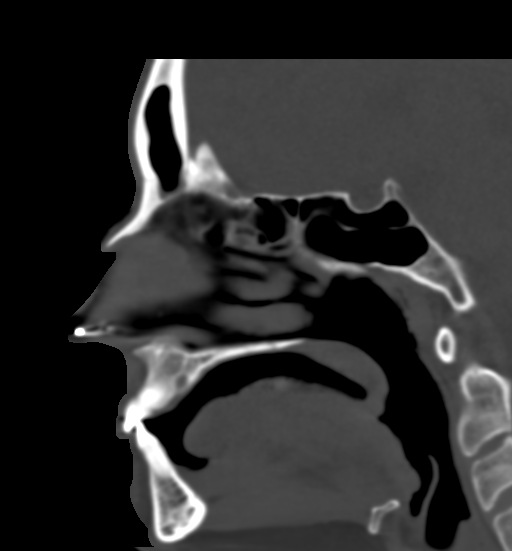
[im 52/82  bone]
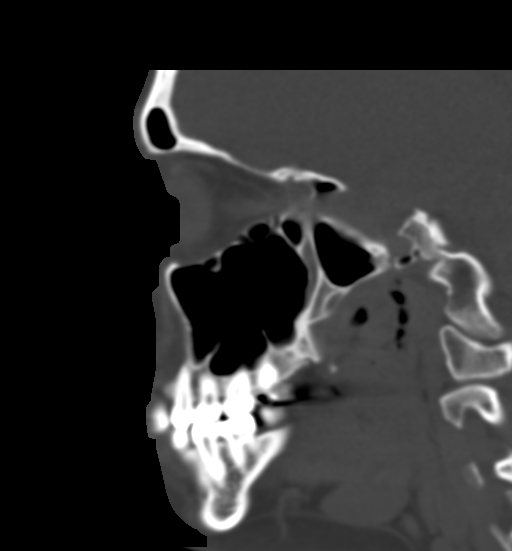

[16 of 47 positions shown; findings below may reference images not displayed]

FINDINGS: Osseous: Mandibular heads are normally positioned. No mandibular
fracture. Image mastoid air cells are clear. Pterygoid plates and
zygomatic arches are intact. No acute nasal bone fracture

Orbits: Negative. No traumatic or inflammatory finding.

Sinuses: No acute fluid levels. Mild mucosal thickening in the
sphenoid and ethmoid sinuses. No sinus wall fracture.

Soft tissues: Soft tissue laceration in the left infraorbital region
and nasal area.

Limited intracranial: No significant or unexpected finding.
IMPRESSION: 1. No acute facial bone fracture is seen
2. Soft tissue lacerations over the nasal area and left infraorbital
region.

## 2023-08-04 ENCOUNTER — Ambulatory Visit: Payer: Self-pay

## 2023-08-04 ENCOUNTER — Other Ambulatory Visit: Payer: Self-pay | Admitting: Family Medicine

## 2023-08-04 DIAGNOSIS — Z021 Encounter for pre-employment examination: Secondary | ICD-10-CM

## 2023-11-18 ENCOUNTER — Inpatient Hospital Stay (HOSPITAL_COMMUNITY)
Admission: EM | Admit: 2023-11-18 | Discharge: 2023-11-21 | DRG: 175 | Disposition: A | Discharge status: Home / Self Care | Attending: Internal Medicine | Admitting: Internal Medicine

## 2023-11-18 ENCOUNTER — Emergency Department (HOSPITAL_COMMUNITY)

## 2023-11-18 ENCOUNTER — Other Ambulatory Visit: Payer: Self-pay

## 2023-11-18 ENCOUNTER — Encounter (HOSPITAL_COMMUNITY): Payer: Self-pay | Admitting: *Deleted

## 2023-11-18 DIAGNOSIS — E876 Hypokalemia: Secondary | ICD-10-CM | POA: Insufficient documentation

## 2023-11-18 DIAGNOSIS — Z79899 Other long term (current) drug therapy: Secondary | ICD-10-CM | POA: Diagnosis not present

## 2023-11-18 DIAGNOSIS — I272 Pulmonary hypertension, unspecified: Secondary | ICD-10-CM | POA: Diagnosis present

## 2023-11-18 DIAGNOSIS — Z6835 Body mass index (BMI) 35.0-35.9, adult: Secondary | ICD-10-CM

## 2023-11-18 DIAGNOSIS — F32A Depression, unspecified: Secondary | ICD-10-CM | POA: Diagnosis present

## 2023-11-18 DIAGNOSIS — I82441 Acute embolism and thrombosis of right tibial vein: Secondary | ICD-10-CM | POA: Diagnosis present

## 2023-11-18 DIAGNOSIS — I2609 Other pulmonary embolism with acute cor pulmonale: Principal | ICD-10-CM | POA: Diagnosis present

## 2023-11-18 DIAGNOSIS — E8721 Acute metabolic acidosis: Secondary | ICD-10-CM | POA: Diagnosis present

## 2023-11-18 DIAGNOSIS — I82451 Acute embolism and thrombosis of right peroneal vein: Secondary | ICD-10-CM | POA: Diagnosis present

## 2023-11-18 DIAGNOSIS — Z800 Family history of malignant neoplasm of digestive organs: Secondary | ICD-10-CM

## 2023-11-18 DIAGNOSIS — J9601 Acute respiratory failure with hypoxia: Secondary | ICD-10-CM | POA: Diagnosis present

## 2023-11-18 DIAGNOSIS — E669 Obesity, unspecified: Secondary | ICD-10-CM | POA: Diagnosis present

## 2023-11-18 DIAGNOSIS — Z86711 Personal history of pulmonary embolism: Secondary | ICD-10-CM | POA: Diagnosis not present

## 2023-11-18 DIAGNOSIS — D509 Iron deficiency anemia, unspecified: Secondary | ICD-10-CM | POA: Diagnosis present

## 2023-11-18 DIAGNOSIS — R7989 Other specified abnormal findings of blood chemistry: Secondary | ICD-10-CM | POA: Diagnosis present

## 2023-11-18 DIAGNOSIS — N920 Excessive and frequent menstruation with regular cycle: Secondary | ICD-10-CM | POA: Diagnosis present

## 2023-11-18 DIAGNOSIS — E8770 Fluid overload, unspecified: Secondary | ICD-10-CM | POA: Diagnosis present

## 2023-11-18 DIAGNOSIS — E538 Deficiency of other specified B group vitamins: Secondary | ICD-10-CM | POA: Diagnosis present

## 2023-11-18 DIAGNOSIS — N83209 Unspecified ovarian cyst, unspecified side: Secondary | ICD-10-CM | POA: Diagnosis present

## 2023-11-18 DIAGNOSIS — F418 Other specified anxiety disorders: Secondary | ICD-10-CM | POA: Diagnosis present

## 2023-11-18 DIAGNOSIS — F419 Anxiety disorder, unspecified: Secondary | ICD-10-CM | POA: Diagnosis present

## 2023-11-18 DIAGNOSIS — K219 Gastro-esophageal reflux disease without esophagitis: Secondary | ICD-10-CM | POA: Diagnosis present

## 2023-11-18 DIAGNOSIS — I82431 Acute embolism and thrombosis of right popliteal vein: Secondary | ICD-10-CM | POA: Diagnosis present

## 2023-11-18 DIAGNOSIS — R000 Tachycardia, unspecified: Secondary | ICD-10-CM | POA: Diagnosis present

## 2023-11-18 DIAGNOSIS — I2699 Other pulmonary embolism without acute cor pulmonale: Secondary | ICD-10-CM | POA: Diagnosis present

## 2023-11-18 HISTORY — DX: Personal history of pulmonary embolism: Z86.711

## 2023-11-18 LAB — HCG, SERUM, QUALITATIVE: Preg, Serum: NEGATIVE

## 2023-11-18 LAB — CBC
HCT: 34.6 % — ABNORMAL LOW (ref 36.0–46.0)
HCT: 33.3 % — ABNORMAL LOW (ref 36.0–46.0)
Hemoglobin: 10.1 g/dL — ABNORMAL LOW (ref 12.0–15.0)
Hemoglobin: 9.8 g/dL — ABNORMAL LOW (ref 12.0–15.0)
MCH: 20.6 pg — ABNORMAL LOW (ref 26.0–34.0)
MCH: 20.7 pg — ABNORMAL LOW (ref 26.0–34.0)
MCHC: 29.2 g/dL — ABNORMAL LOW (ref 30.0–36.0)
MCHC: 29.4 g/dL — ABNORMAL LOW (ref 30.0–36.0)
MCV: 70.6 fL — ABNORMAL LOW (ref 80.0–100.0)
MCV: 70.4 fL — ABNORMAL LOW (ref 80.0–100.0)
Platelets: 339 10*3/uL (ref 150–400)
Platelets: 320 10*3/uL (ref 150–400)
RBC: 4.9 MIL/uL (ref 3.87–5.11)
RBC: 4.73 MIL/uL (ref 3.87–5.11)
RDW: 17.5 % — ABNORMAL HIGH (ref 11.5–15.5)
RDW: 17.2 % — ABNORMAL HIGH (ref 11.5–15.5)
WBC: 11.9 10*3/uL — ABNORMAL HIGH (ref 4.0–10.5)
WBC: 11.7 10*3/uL — ABNORMAL HIGH (ref 4.0–10.5)
nRBC: 0 % (ref 0.0–0.2)
nRBC: 0 % (ref 0.0–0.2)

## 2023-11-18 LAB — BASIC METABOLIC PANEL WITH GFR
Anion gap: 14 (ref 5–15)
BUN: 10 mg/dL (ref 6–20)
CO2: 18 mmol/L — ABNORMAL LOW (ref 22–32)
Calcium: 9.1 mg/dL (ref 8.9–10.3)
Chloride: 104 mmol/L (ref 98–111)
Creatinine, Ser: 0.98 mg/dL (ref 0.44–1.00)
GFR, Estimated: >60 mL/min (ref >60)
Glucose, Bld: 146 mg/dL — ABNORMAL HIGH (ref 70–99)
Potassium: 3.5 mmol/L (ref 3.5–5.1)
Sodium: 136 mmol/L (ref 135–145)

## 2023-11-18 LAB — TROPONIN I (HIGH SENSITIVITY)
Troponin I (High Sensitivity): 38 ng/L — ABNORMAL HIGH (ref <18)
Troponin I (High Sensitivity): 45 ng/L — ABNORMAL HIGH (ref <18)

## 2023-11-18 MED ORDER — TRAZODONE HCL 50 MG PO TABS
50.0000 mg | ORAL_TABLET | Freq: Every evening | ORAL | Status: DC | PRN
  Administered 2023-11-19 – 2023-11-20 (×3): 50 mg via ORAL
  Filled 2023-11-18 (×3): qty 1

## 2023-11-18 MED ORDER — HEPARIN (PORCINE) 25000 UT/250ML-% IV SOLN
2450.0000 [IU]/h | INTRAVENOUS | Status: AC
  Administered 2023-11-18: 1600 [IU]/h via INTRAVENOUS
  Administered 2023-11-19: 2100 [IU]/h via INTRAVENOUS
  Administered 2023-11-19: 1750 [IU]/h via INTRAVENOUS
  Filled 2023-11-18 (×3): qty 250

## 2023-11-18 MED ORDER — HYDROXYZINE HCL 25 MG PO TABS
25.0000 mg | ORAL_TABLET | Freq: Three times a day (TID) | ORAL | Status: DC | PRN
  Administered 2023-11-19 – 2023-11-21 (×4): 25 mg via ORAL
  Filled 2023-11-18 (×4): qty 1

## 2023-11-18 MED ORDER — ONDANSETRON HCL 4 MG PO TABS: 4.0000 mg | ORAL_TABLET | Freq: Four times a day (QID) | ORAL | Status: DC | PRN

## 2023-11-18 MED ORDER — SODIUM CHLORIDE 0.9% FLUSH
3.0000 mL | Freq: Two times a day (BID) | INTRAVENOUS | Status: DC
  Administered 2023-11-19 – 2023-11-21 (×4): 3 mL via INTRAVENOUS

## 2023-11-18 MED ORDER — POLYETHYLENE GLYCOL 3350 17 G PO PACK: 17.0000 g | PACK | Freq: Every day | ORAL | Status: DC | PRN

## 2023-11-18 MED ORDER — ONDANSETRON HCL 4 MG/2ML IJ SOLN: 4.0000 mg | Freq: Four times a day (QID) | INTRAMUSCULAR | Status: DC | PRN

## 2023-11-18 MED ORDER — BUPROPION HCL 100 MG PO TABS
100.0000 mg | ORAL_TABLET | Freq: Every day | ORAL | Status: DC
  Administered 2023-11-19 – 2023-11-21 (×3): 100 mg via ORAL
  Filled 2023-11-18 (×3): qty 1

## 2023-11-18 MED ORDER — PANTOPRAZOLE SODIUM 40 MG PO TBEC
40.0000 mg | DELAYED_RELEASE_TABLET | Freq: Every day | ORAL | Status: DC
  Administered 2023-11-19 – 2023-11-21 (×3): 40 mg via ORAL
  Filled 2023-11-18 (×3): qty 1

## 2023-11-18 MED ORDER — ESCITALOPRAM OXALATE 20 MG PO TABS
20.0000 mg | ORAL_TABLET | Freq: Every day | ORAL | Status: DC
  Administered 2023-11-19 – 2023-11-21 (×3): 20 mg via ORAL
  Filled 2023-11-18: qty 1
  Filled 2023-11-18: qty 2
  Filled 2023-11-18: qty 1

## 2023-11-18 MED ORDER — BUPROPION HCL 100 MG PO TABS: 100.0000 mg | ORAL_TABLET | Freq: Two times a day (BID) | ORAL | Status: DC

## 2023-11-18 MED ORDER — HYDROMORPHONE HCL 1 MG/ML IJ SOLN: 0.5000 mg | INTRAMUSCULAR | Status: DC | PRN

## 2023-11-18 MED ORDER — OXYCODONE HCL 5 MG PO TABS: 5.0000 mg | ORAL_TABLET | ORAL | Status: DC | PRN

## 2023-11-18 MED ORDER — ACETAMINOPHEN 325 MG PO TABS
650.0000 mg | ORAL_TABLET | Freq: Four times a day (QID) | ORAL | Status: DC | PRN
  Administered 2023-11-21: 650 mg via ORAL
  Filled 2023-11-18: qty 2

## 2023-11-18 MED ORDER — ACETAMINOPHEN 650 MG RE SUPP: 650.0000 mg | Freq: Four times a day (QID) | RECTAL | Status: DC | PRN

## 2023-11-18 MED ORDER — HEPARIN BOLUS VIA INFUSION
6500.0000 [IU] | Freq: Once | INTRAVENOUS | Status: AC
  Administered 2023-11-18: 6500 [IU] via INTRAVENOUS
  Filled 2023-11-18: qty 6500

## 2023-11-18 MED ORDER — IOHEXOL 350 MG/ML SOLN
75.0000 mL | Freq: Once | INTRAVENOUS | Status: AC | PRN
  Administered 2023-11-18: 75 mL via INTRAVENOUS

## 2023-11-18 NOTE — Progress Notes (Signed)
 PHARMACY - ANTICOAGULATION CONSULT NOTE  Pharmacy Consult for IV heparin Indication: pulmonary embolus  No Known Allergies  Patient Measurements: Height: 6\' 2"  (188 cm) Weight: 94.6 kg (208 lb 8.9 oz) IBW/kg (Calculated) : 77.7 HEPARIN DW (KG): 94.6  Vital Signs: Temp: 97.7 F (36.5 C) (05/09 2004) BP: 153/87 (05/09 2100) Pulse Rate: 121 (05/09 2100)  Labs: Recent Labs    11/18/23 2029  HGB 10.1*  HCT 34.6*  PLT 339  CREATININE 0.98  TROPONINIHS 38*    Estimated Creatinine Clearance: 103.8 mL/min (by C-G formula based on SCr of 0.98 mg/dL).   Medical History: Past Medical History:  Diagnosis Date   Anxiety    Depression    Gallbladder polyp    GERD (gastroesophageal reflux disease)    IBS (irritable bowel syndrome)    Obesity    Assessment: Tiffany English is a 38 y.o. year old female admitted on 11/18/2023 with concern for PE. CTA with bilateral PE and R heart strain noted. No anticoagulation prior to admission on dispense history. Pharmacy consulted to dose heparin.  Goal of Therapy:  Heparin level 0.3-0.7 units/ml Monitor platelets by anticoagulation protocol: Yes   Plan:  Heparin 6500 units x 1 as bolus followed by heparin infusion at 1600 units/hr 6 heparin level  Daily heparin level, CBC, and monitoring for bleeding F/u plans for anticoagulation   Thank you for allowing pharmacy to participate in this patient's care.  Fonda Hymen, PharmD Emergency Medicine Clinical Pharmacist 11/18/2023,10:34 PM

## 2023-11-18 NOTE — ED Notes (Signed)
 Pa notified of pt

## 2023-11-18 NOTE — ED Triage Notes (Signed)
 The pt is c/I chest pain and she is hyperventilating  and was seen at an urgent care and they sent her here  for   A c-t to r/o a blood clot due to a drug that she has been taking   lmp 2 weeks ago  low sats at present but her fingers are all cold   nasal 02 given for comfort  the pt admits to anxiety

## 2023-11-18 NOTE — ED Notes (Addendum)
 Pt to CT via stretcher

## 2023-11-18 NOTE — ED Provider Notes (Signed)
 [place] EMERGENCY DEPARTMENT AT [facility] Provider Note   CSN: [id number] Arrival date & time: 11/18/23  1953     History  Chief Complaint  Patient presents with   Chest Pain    Tiffany English is a 38 y.o. female.   Chest Pain Associated symptoms: shortness of breath   Patient is a 38 year old female presents the ED today with complaints of shortness of breath, cough, fatigue x 4 days.  She suspected to be a URI.  Seen by her primary care provider and was sent here today to rule out PE due to being tachycardic and having recently started birth control 5 months ago which she recently started for abnormal bleeding.  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  No previous past medical history of DVTs or PEs.  Denies fever, headaches, visual changes, chest pain, abdominal pain, nausea, vomiting, diarrhea, lower leg swelling.     Home Medications Prior to Admission medications   Medication Sig Start Date End Date Taking? Authorizing Provider  clidinium-chlordiazePOXIDE (LIBRAX) 2.5-5 MG per capsule TAKE ONE BY MOUTH THREE TIMES A DAY Patient taking differently: TAKE ONE BY MOUTH DAILY 05/11/11   Tessa Figures, MD  lansoprazole (PREVACID) 30 MG capsule TAKE ONE CAPSULE BY MOUTH EVERY DAY 07/04/11   Tessa Figures, MD      Allergies    Patient has no known allergies.    Review of Systems   Review of Systems  Respiratory:  Positive for shortness of breath.   Cardiovascular:  Positive for chest pain.  All other systems reviewed and are negative.   Physical Exam Updated Vital Signs BP (!) 152/100 (BP Location: Right Arm)   Pulse (!) 117   Temp (!) 97.5 F (36.4 C) (Oral)   Resp 20   Ht 6\' 2"  (1.88 m)   Wt 94.6 kg   LMP 11/04/2023   SpO2 97%   BMI 26.78 kg/m  Physical Exam Vitals and nursing note reviewed.  Constitutional:      General: She is not in acute distress.    Appearance: Normal  appearance. She is ill-appearing.  HENT:     Head: Normocephalic and atraumatic.  Eyes:     Extraocular Movements: Extraocular movements intact.     Conjunctiva/sclera: Conjunctivae normal.  Cardiovascular:     Rate and Rhythm: Regular rhythm. Tachycardia present.     Pulses: Normal pulses.     Heart sounds: Normal heart sounds. No murmur heard.    No friction rub. No gallop.  Pulmonary:     Effort: Pulmonary effort is normal. No respiratory distress.     Breath sounds: Normal breath sounds. No decreased breath sounds, wheezing, rhonchi or rales.  Chest:     Chest wall: No tenderness or crepitus.  Abdominal:     General: Abdomen is flat.     Palpations: Abdomen is soft.     Tenderness: There is no abdominal tenderness. There is no guarding or rebound.  Musculoskeletal:        General: Normal range of motion.     Right lower leg: No tenderness. No edema.     Left lower leg: No tenderness. No edema.     Comments: Celine Collard' sign negative bilaterally  Skin:    General: Skin is warm and dry.     Findings: No erythema.  Neurological:     General: No focal deficit present.     Mental  Status: She is alert and oriented to person, place, and time. Mental status is at baseline.     Cranial Nerves: No cranial nerve deficit.     Motor: No weakness.  Psychiatric:        Mood and Affect: Mood is anxious.     ED Results / Procedures / Treatments   Labs (all labs ordered are listed, but only abnormal results are displayed) Labs Reviewed  BASIC METABOLIC PANEL WITH GFR - Abnormal; Notable for the following components:      Result Value   CO2 18 (*)    Glucose, Bld 146 (*)    All other components within normal limits  CBC - Abnormal; Notable for the following components:   WBC 11.9 (*)    Hemoglobin 10.1 (*)    HCT 34.6 (*)    MCV 70.6 (*)    MCH 20.6 (*)    MCHC 29.2 (*)    RDW 17.5 (*)    All other components within normal limits  CBC - Abnormal; Notable for the following  components:   WBC 11.7 (*)    Hemoglobin 9.8 (*)    HCT 33.3 (*)    MCV 70.4 (*)    MCH 20.7 (*)    MCHC 29.4 (*)    RDW 17.2 (*)    All other components within normal limits  TROPONIN I (HIGH SENSITIVITY) - Abnormal; Notable for the following components:   Troponin I (High Sensitivity) 38 (*)    All other components within normal limits  TROPONIN I (HIGH SENSITIVITY) - Abnormal; Notable for the following components:   Troponin I (High Sensitivity) 45 (*)    All other components within normal limits  HCG, SERUM, QUALITATIVE  HEPARIN  LEVEL (UNFRACTIONATED)    EKG EKG Interpretation Date/Time:  Friday Nov 18 2023 16:10:96 EDT Ventricular Rate:  121 PR Interval:  166 QRS Duration:  88 QT Interval:  312 QTC Calculation: 443 R Axis:   90  Text Interpretation: Sinus tachycardia Rightward axis Cannot rule out Anterior infarct , age undetermined Abnormal ECG No previous ECGs available Confirmed by Alissa April ([id number]) on 11/18/2023 10:59:56 PM  Radiology CT Angio Chest PE W and/or Wo Contrast Result Date: 11/18/2023 CLINICAL DATA:  Pulmonary embolism (PE) suspected, high prob Chest pain. EXAM: CT ANGIOGRAPHY CHEST WITH CONTRAST TECHNIQUE: Multidetector CT imaging of the chest was performed using the standard protocol during bolus administration of intravenous contrast. Multiplanar CT image reconstructions and MIPs were obtained to evaluate the vascular anatomy. RADIATION DOSE REDUCTION: This exam was performed according to the departmental dose-optimization program which includes automated exposure control, adjustment of the mA and/or kV according to patient size and/or use of iterative reconstruction technique. CONTRAST:  75mL OMNIPAQUE  IOHEXOL  350 MG/ML SOLN COMPARISON:  None Available. FINDINGS: Cardiovascular: Acute bilateral pulmonary emboli. There are filling defects involving the segmental branches in the right upper lobe, right middle lobe, lingular, and both lower lobe pulmonary  arteries. Thromboembolic burden is moderate. There is right heart strain. RV to LV ratio is 1.49. Main pulmonary artery is dilated 4.4 cm. Overall heart size is normal. Common origin of the brachiocephalic and left common carotid artery, variant arch anatomy. Mediastinum/Nodes: 16 mm anterior paratracheal node series 5, image 42. Additional shotty mediastinal lymph nodes. No enlarged hilar nodes. Unremarkable esophagus. Lungs/Pleura: Faint area of ground-glass in the right upper lobe series 6, image 52. No confluent consolidation. No pleural effusion. No features of pulmonary edema. 4 mm right lower lobe pulmonary nodule series  6, image 96. Upper Abdomen: Low-density lesions in the liver are too small to characterize but typically cysts. No acute upper abdominal findings. Musculoskeletal: Mild scoliosis and degenerative change in the spine. There are no acute or suspicious osseous abnormalities. Review of the MIP images confirms the above findings. IMPRESSION: 1. Positive for acute bilateral pulmonary emboli with CT evidence of right heart strain (RV/LV Ratio = 1.49) consistent with at least submassive (intermediate risk) PE. Moderate thromboembolic burden. The presence of right heart strain has been associated with an increased risk of morbidity and mortality. Please refer to the "Code PE Focused" order set in EPIC. 2. Dilated main pulmonary artery. 3. Faint area of ground-glass in the right upper lobe, nonspecific. This may represent an area of inflammation or developing pulmonary infarct. 4. A 4 mm right lower lobe pulmonary nodule.Per Fleischner Society Guidelines, no routine follow-up imaging is recommended. These guidelines do not apply to immunocompromised patients and patients with cancer. Follow up in patients with significant comorbidities as clinically warranted. For lung cancer screening, adhere to Lung-RADS guidelines. Reference: Radiology. 2017; 284(1):228-43. Critical Value/emergent results were  called by telephone at the time of interpretation on 11/18/2023 at 10:24 pm to provider Cherre Cornish, who verbally acknowledged these results. Electronically Signed   By: Chadwick Colonel M.D.   On: 11/18/2023 22:27    Procedures .Critical Care  Performed by: Hayes Lipps, PA-C Authorized by: Hayes Lipps, PA-C   Critical care provider statement:    Critical care time (minutes):  59   Critical care was necessary to treat or prevent imminent or life-threatening deterioration of the following conditions:  Respiratory failure   Critical care was time spent personally by me on the following activities:  Development of treatment plan with patient or surrogate, discussions with consultants, evaluation of patient's response to treatment, examination of patient, obtaining history from patient or surrogate, ordering and performing treatments and interventions, ordering and review of laboratory studies, ordering and review of radiographic studies, pulse oximetry, re-evaluation of patient's condition and review of old charts   I assumed direction of critical care for this patient from another provider in my specialty: yes     Care discussed with: admitting provider       Medications Ordered in ED Medications  heparin  ADULT infusion 100 units/mL (25000 units/250mL) (1,600 Units/hr Intravenous New Bag/Given 11/18/23 2313)  iohexol  (OMNIPAQUE ) 350 MG/ML injection 75 mL (75 mLs Intravenous Contrast Given 11/18/23 2137)  heparin  bolus via infusion 6,500 Units (6,500 Units Intravenous Bolus from Bag 11/18/23 2313)    ED Course/ Medical Decision Making/ A&P Clinical Course as of 11/18/23 2327  Fri Nov 18, 2023  2239 Spoke with Dr. Lillie Reining, intensivist, who believed that due to her symptoms lasting for the last 4 days, that she did not warrant a ICU admission at this time.  Believe that she was fit for a stepdown unit at this time. [CB]    Clinical Course User Index [CB] Hayes Lipps, PA-C                                  Medical Decision Making This patient is a 38 year old female who presents to the ED for concern of tachycardia, shortness of breath x 4 days, seen by primary care today and was sent here for to rule out PE due to concerns of having started Loestrin 5 months ago concomitant with current symptoms.  On  physical exam, patient is in acute distress, alert and orient x 4, speaking in broken sentences sentences, tachycardic with BP of low 120s and borderline tachypneic with respirations of 20.  Lung sounds are noted to be slightly decreased with no wheezes or rales noted.  No murmur noted.  Celine Collard' sign negative.  No lower leg swelling noted.  Currently on 2 L via nasal cannula.  Exam is otherwise unremarkable.  CT angio revealed a submassive PE with right heart strain and dilated main pulmonary artery.  Heparin  per pharmacy order was put in.  And patient was placed on heparin .  Spoke with Dr. Bernetta Brilliant, intensivist who believe that she did not want a ICU admission at this time with her being 4 days post initial onset and having stable vitals outside of a sustained tachycardia.  Hospitalist was then consulted.  Patient care was then transferred over to Dr. Brice Campi.   Differential diagnoses prior to evaluation: The emergent differential diagnosis includes, but is not limited to, PE, ACS, AAS, pneumonia, arrhythmia, pleural effusion. This is not an exhaustive differential.   Past Medical History / Co-morbidities / Social History: Obesity, anxiety, depression, IBS, GERD  Additional history: Chart reviewed.  Lab Tests/Imaging studies: I personally interpreted labs/imaging and the pertinent results include:    CBC noted to have an elevated white count 11.9 and anemia of 10.1 however anemia appears to be chronic. BMP shows a decrease CO2 of 18. Troponin 38 and delta troponin 45. hCG qualitative negative.  CT angio shows bilateral pulmonary emboli with right heart strain being at least  submassive.  Dilated main pulmonary artery.  Faint groundglass opacities in right upper lobe.  As well as a 4 mm right lower lobe pulmonary nodule.   I agree with the radiologist interpretation.  Cardiac monitoring: EKG obtained and interpreted by myself and attending physician which shows: Sinus tachycardia with rightward axis  EKG Interpretation Date/Time:  Friday Nov 18 2023 20:23:24 EDT Ventricular Rate:  121 PR Interval:  166 QRS Duration:  88 QT Interval:  312 QTC Calculation: 443 R Axis:   90  Text Interpretation: Sinus tachycardia Rightward axis Cannot rule out Anterior infarct , age undetermined Abnormal ECG No previous ECGs available Confirmed by Alissa April ([id number]) on 11/18/2023 10:59:56 PM          Medications: I ordered medication including heparin .  I have reviewed the patients home medicines and have made adjustments as needed.  Critical Interventions: Provide heparin  for submassive PE  Social Determinants of Health:  Disposition: After consideration of the diagnostic results and the patients response to treatment, I feel that the patient would benefit from admission, Dr. Brice Campi accepted care of patient patient .  Final Clinical Impression(s) / ED Diagnoses Final diagnoses:  Acute pulmonary embolism with acute cor pulmonale, unspecified pulmonary embolism type [facility])    Rx / DC Orders ED Discharge Orders     None         Vevelyn Gowers 11/18/23 2337    Wynetta Heckle, MD 11/26/23 [phone]

## 2023-11-18 NOTE — ED Provider Triage Note (Signed)
 Emergency Medicine Provider Triage Evaluation Note  Tiffany English , a 38 y.o. female  was evaluated in triage.  Pt complains of shortness of breath, hypoxic at 90% in triage.  Currently on supplemental O2 via nasal cannula no prior history of DVT or PE.Tiffany English  Reports normal x-ray of the chest at urgent care  Review of Systems  Positive: As above Negative: As above  Physical Exam  BP (!) 182/109 (BP Location: Left Arm)   Pulse (!) 127   Temp 97.7 F (36.5 C)   Resp 18   Ht 6\' 2"  (1.88 m)   Wt 94.6 kg   LMP 11/04/2023   SpO2 92%   BMI 26.78 kg/m  Gen:   Awake, no distress   Resp:  Normal effort  MSK:   Moves extremities without difficulty  Other:    Medical Decision Making  Medically screening exam initiated at 8:28 PM.  Appropriate orders placed.  Tiffany English was informed that the remainder of the evaluation will be completed by another provider, this initial triage assessment does not replace that evaluation, and the importance of remaining in the ED until their evaluation is complete.     Tiffany Sabal, PA-C 11/18/23 2029

## 2023-11-18 NOTE — ED Provider Notes (Signed)
 I provided a substantive portion of the care of this patient.  I personally made/approved the management plan for this patient and take responsibility for the patient management.     Patient has been feeling short of breath for several days.  She reports that she thought she was coming down with a virus.  She had had influenza before and assumed that it was something similar.  However, she became increasingly short of breath and checked her pulse ox at home and found it to be low.  At that time she decided to come to the emergency department.  Patient has no personal history of PE.  Alert.  Nontoxic.  Patient is mildly tachypneic.  Mental status clear.  Heart tachycardic no appreciable rub murmur gallop.  Lungs clear to auscultation.  No lower extremity tenderness or significant peripheral edema.  CT shows submassive PE with right heart strain.  Patient is tachycardic.  Troponin elevated.  Blood pressures are stable.  Patient is 95 to 98% on 2 L.  PA-C [address] has ordered heparin and will consult intensivist.   Wynetta Heckle, MD 11/18/23 2237

## 2023-11-18 NOTE — H&P (Addendum)
 History and Physical    Tiffany English ZOX:[id number] DOB: [birth year] DOA: 11/18/2023  PCP: Patient, No Pcp Per   Patient coming from: Home   Chief Complaint: SOB   HPI: Tiffany English is a 38 y.o. female with medical history significant for depression, anxiety, and GERD who presents with shortness of breath.  Patient reports several days of progressive shortness of breath.  She initially had some upper respiratory symptoms as well and suspected she had contracted a virus.  She denies chest pain, leg swelling or tenderness, or personal history of DVT or PE.  She has been taking ethynyl estradiol and norethindrone.  She denies smoking.  ED Course: Upon arrival to the ED, patient is found to be afebrile and saturating low 90s on room air with elevated heart rate and elevated blood pressure.  EKG demonstrates sinus tachycardia 321 and RAD.  Labs are most notable for normal renal function, WBC 11,900, hemoglobin 10.1, and troponin 38.  CTA chest is concerning for acute bilateral pulmonary embolism with RV/LV ratio 1.49.  PCCM (Dr. Mason Sole) was consulted by the ED PA, did not feel that the patient need to be in the ICU, and recommended hospitalist admission. She was started on IV heparin in the ED.  Review of Systems:  All other systems reviewed and apart from HPI, are negative.  Past Medical History:  Diagnosis Date   Anxiety    Depression    Gallbladder polyp    GERD (gastroesophageal reflux disease)    IBS (irritable bowel syndrome)    Obesity     History reviewed. No pertinent surgical history.  Social History:   reports that she has never smoked. She does not have any smokeless tobacco history on file. She reports current alcohol use. She reports that she does not use drugs.  No Known Allergies  Family History  Problem Relation Age of Onset   Colon cancer Unknown        uncle   Colon cancer Unknown        grandfather   Prostate cancer Unknown        uncle   Liver disease  Other        great grandmother     Prior to Admission medications   Medication Sig Start Date End Date Taking? Authorizing Provider  buPROPion ER (WELLBUTRIN SR) 100 MG 12 hr tablet Take 100 mg by mouth 2 (two) times daily.   Yes [provider]  escitalopram (LEXAPRO) 20 MG tablet Take 20 mg by mouth daily.   Yes [provider]  clidinium-chlordiazePOXIDE (LIBRAX) 2.5-5 MG per capsule TAKE ONE BY MOUTH THREE TIMES A DAY Patient taking differently: TAKE ONE BY MOUTH DAILY 05/11/11   Tessa Figures, MD  lansoprazole (PREVACID) 30 MG capsule TAKE ONE CAPSULE BY MOUTH EVERY DAY 07/04/11   Tessa Figures, MD    Physical Exam: Vitals:   11/18/23 2004 11/18/23 2013 11/18/23 2100 11/18/23 2251  BP: (!) 182/109  (!) 153/87 (!) 152/100  Pulse: (!) 127  (!) 121 (!) 117  Resp: 18  20 20   Temp: 97.7 F (36.5 C)   (!) 97.5 F (36.4 C)  TempSrc:    Oral  SpO2: 92%  95% 97%  Weight:  94.6 kg    Height:  6\' 2"  (1.88 m)      Constitutional: NAD, no diaphoresis   Eyes: PERTLA, lids and conjunctivae normal ENMT: Mucous membranes are moist. Posterior pharynx clear of any exudate or lesions.   Neck:  supple, no masses  Respiratory: no wheezing, no crackles. No accessory muscle use.  Cardiovascular: S1 & S2 heard, regular rate and rhythm. No extremity edema.  Abdomen: No tenderness, soft. Bowel sounds active.  Musculoskeletal: no clubbing / cyanosis. No joint deformity upper and lower extremities.   Skin: no significant rashes, lesions, ulcers. Warm, dry, well-perfused. Neurologic: CN 2-12 grossly intact. Moving all extremities. Alert and oriented.  Psychiatric: Anxious. Cooperative.    Labs and Imaging on Admission: I have personally reviewed following labs and imaging studies  CBC: Recent Labs  Lab 11/18/23 2029 11/18/23 2300  WBC 11.9* 11.7*  HGB 10.1* 9.8*  HCT 34.6* 33.3*  MCV 70.6* 70.4*  PLT 339 320   Basic Metabolic Panel: Recent Labs  Lab  11/18/23 2029  NA 136  K 3.5  CL 104  CO2 18*  GLUCOSE 146*  BUN 10  CREATININE 0.98  CALCIUM 9.1   GFR: Estimated Creatinine Clearance: 103.8 mL/min (by C-G formula based on SCr of 0.98 mg/dL). Liver Function Tests: No results for input(s): "AST", "ALT", "ALKPHOS", "BILITOT", "PROT", "ALBUMIN" in the last 168 hours. No results for input(s): "LIPASE", "AMYLASE" in the last 168 hours. No results for input(s): "AMMONIA" in the last 168 hours. Coagulation Profile: No results for input(s): "INR", "PROTIME" in the last 168 hours. Cardiac Enzymes: No results for input(s): "CKTOTAL", "CKMB", "CKMBINDEX", "TROPONINI" in the last 168 hours. BNP (last 3 results) No results for input(s): "PROBNP" in the last 8760 hours. HbA1C: No results for input(s): "HGBA1C" in the last 72 hours. CBG: No results for input(s): "GLUCAP" in the last 168 hours. Lipid Profile: No results for input(s): "CHOL", "HDL", "LDLCALC", "TRIG", "CHOLHDL", "LDLDIRECT" in the last 72 hours. Thyroid Function Tests: No results for input(s): "TSH", "T4TOTAL", "FREET4", "T3FREE", "THYROIDAB" in the last 72 hours. Anemia Panel: No results for input(s): "VITAMINB12", "FOLATE", "FERRITIN", "TIBC", "IRON", "RETICCTPCT" in the last 72 hours. Urine analysis: No results found for: "COLORURINE", "APPEARANCEUR", "LABSPEC", "PHURINE", "GLUCOSEU", "HGBUR", "BILIRUBINUR", "KETONESUR", "PROTEINUR", "UROBILINOGEN", "NITRITE", "LEUKOCYTESUR" Sepsis Labs: @LABRCNTIP (procalcitonin:4,lacticidven:4) )No results found for this or any previous visit (from the past 240 hours).   Radiological Exams on Admission: CT Angio Chest PE W and/or Wo Contrast Result Date: 11/18/2023 CLINICAL DATA:  Pulmonary embolism (PE) suspected, high prob Chest pain. EXAM: CT ANGIOGRAPHY CHEST WITH CONTRAST TECHNIQUE: Multidetector CT imaging of the chest was performed using the standard protocol during bolus administration of intravenous contrast. Multiplanar CT  image reconstructions and MIPs were obtained to evaluate the vascular anatomy. RADIATION DOSE REDUCTION: This exam was performed according to the departmental dose-optimization program which includes automated exposure control, adjustment of the mA and/or kV according to patient size and/or use of iterative reconstruction technique. CONTRAST:  75mL OMNIPAQUE IOHEXOL 350 MG/ML SOLN COMPARISON:  None Available. FINDINGS: Cardiovascular: Acute bilateral pulmonary emboli. There are filling defects involving the segmental branches in the right upper lobe, right middle lobe, lingular, and both lower lobe pulmonary arteries. Thromboembolic burden is moderate. There is right heart strain. RV to LV ratio is 1.49. Main pulmonary artery is dilated 4.4 cm. Overall heart size is normal. Common origin of the brachiocephalic and left common carotid artery, variant arch anatomy. Mediastinum/Nodes: 16 mm anterior paratracheal node series 5, image 42. Additional shotty mediastinal lymph nodes. No enlarged hilar nodes. Unremarkable esophagus. Lungs/Pleura: Faint area of ground-glass in the right upper lobe series 6, image 52. No confluent consolidation. No pleural effusion. No features of pulmonary edema. 4 mm right lower lobe pulmonary nodule series 6, image  96. Upper Abdomen: Low-density lesions in the liver are too small to characterize but typically cysts. No acute upper abdominal findings. Musculoskeletal: Mild scoliosis and degenerative change in the spine. There are no acute or suspicious osseous abnormalities. Review of the MIP images confirms the above findings. IMPRESSION: 1. Positive for acute bilateral pulmonary emboli with CT evidence of right heart strain (RV/LV Ratio = 1.49) consistent with at least submassive (intermediate risk) PE. Moderate thromboembolic burden. The presence of right heart strain has been associated with an increased risk of morbidity and mortality. Please refer to the "Code PE Focused" order set in  EPIC. 2. Dilated main pulmonary artery. 3. Faint area of ground-glass in the right upper lobe, nonspecific. This may represent an area of inflammation or developing pulmonary infarct. 4. A 4 mm right lower lobe pulmonary nodule.Per Fleischner Society Guidelines, no routine follow-up imaging is recommended. These guidelines do not apply to immunocompromised patients and patients with cancer. Follow up in patients with significant comorbidities as clinically warranted. For lung cancer screening, adhere to Lung-RADS guidelines. Reference: Radiology. 2017; 284(1):228-43. Critical Value/emergent results were called by telephone at the time of interpretation on 11/18/2023 at 10:24 pm to provider Cherre Cornish, who verbally acknowledged these results. Electronically Signed   By: Chadwick Colonel M.D.   On: 11/18/2023 22:27    EKG: Independently reviewed. Sinus tachycardia, rate 121, RAD.   Assessment/Plan   1. Acute PE  - Bilateral PE with RV/LV ratio 1.49, stable BP, mild troponin elevation  - Continue IV heparin, continue cardiac monitoring, check echocardiogram and LE venous Dopplers   2. Depression, anxiety  - Continue Lexapro and Wellbutrin   3. Microcytic anemia  - No overt bleeding  - Follow closely while anticoagulating     DVT prophylaxis: IV heparin  Code Status: Full  Level of Care: Level of care: Progressive Family Communication: Mother at bedside  Disposition Plan:  Patient is from: home  Anticipated d/c is to: Home  Anticipated d/c date is: 11/20/23  Patient currently: Pending tolerance of IV heparin, echocardiogram, LE venous Dopplers, transition to oral anticoagulant   Consults called: None  Admission status: Inpatient     Walton Guppy, MD [facility]  11/18/2023, 11:39 PM

## 2023-11-18 NOTE — ED Notes (Signed)
 Dr. Antionette Char at bedside

## 2023-11-19 ENCOUNTER — Inpatient Hospital Stay (HOSPITAL_COMMUNITY)

## 2023-11-19 ENCOUNTER — Encounter (HOSPITAL_COMMUNITY): Payer: Self-pay | Admitting: Family Medicine

## 2023-11-19 DIAGNOSIS — I2609 Other pulmonary embolism with acute cor pulmonale: Secondary | ICD-10-CM

## 2023-11-19 DIAGNOSIS — F418 Other specified anxiety disorders: Secondary | ICD-10-CM | POA: Diagnosis not present

## 2023-11-19 DIAGNOSIS — K219 Gastro-esophageal reflux disease without esophagitis: Secondary | ICD-10-CM

## 2023-11-19 DIAGNOSIS — E876 Hypokalemia: Secondary | ICD-10-CM | POA: Diagnosis not present

## 2023-11-19 DIAGNOSIS — I2699 Other pulmonary embolism without acute cor pulmonale: Secondary | ICD-10-CM | POA: Diagnosis not present

## 2023-11-19 LAB — CBC
HCT: 32.1 % — ABNORMAL LOW (ref 36.0–46.0)
Hemoglobin: 9.5 g/dL — ABNORMAL LOW (ref 12.0–15.0)
MCH: 21.1 pg — ABNORMAL LOW (ref 26.0–34.0)
MCHC: 29.6 g/dL — ABNORMAL LOW (ref 30.0–36.0)
MCV: 71.2 fL — ABNORMAL LOW (ref 80.0–100.0)
Platelets: 300 10*3/uL (ref 150–400)
RBC: 4.51 MIL/uL (ref 3.87–5.11)
RDW: 17.4 % — ABNORMAL HIGH (ref 11.5–15.5)
WBC: 10.2 10*3/uL (ref 4.0–10.5)
nRBC: 0 % (ref 0.0–0.2)

## 2023-11-19 LAB — ECHOCARDIOGRAM COMPLETE
AR max vel: 2.64 cm2
AV Peak grad: 11.2 mmHg
Ao pk vel: 1.67 m/s
Area-P 1/2: 5.13 cm2
Height: 74 in
S' Lateral: 3 cm
Weight: 3336.88 [oz_av]

## 2023-11-19 LAB — HIV ANTIBODY (ROUTINE TESTING W REFLEX): HIV Screen 4th Generation wRfx: NONREACTIVE

## 2023-11-19 LAB — BASIC METABOLIC PANEL WITH GFR
Anion gap: 13 (ref 5–15)
BUN: 7 mg/dL (ref 6–20)
CO2: 17 mmol/L — ABNORMAL LOW (ref 22–32)
Calcium: 8.6 mg/dL — ABNORMAL LOW (ref 8.9–10.3)
Chloride: 106 mmol/L (ref 98–111)
Creatinine, Ser: 0.78 mg/dL (ref 0.44–1.00)
GFR, Estimated: >60 mL/min (ref >60)
Glucose, Bld: 105 mg/dL — ABNORMAL HIGH (ref 70–99)
Potassium: 3.3 mmol/L — ABNORMAL LOW (ref 3.5–5.1)
Sodium: 136 mmol/L (ref 135–145)

## 2023-11-19 LAB — HEPARIN LEVEL (UNFRACTIONATED)
Heparin Unfractionated: 0.11 [IU]/mL — ABNORMAL LOW (ref 0.30–0.70)
Heparin Unfractionated: 0.14 [IU]/mL — ABNORMAL LOW (ref 0.30–0.70)
Heparin Unfractionated: 0.16 [IU]/mL — ABNORMAL LOW (ref 0.30–0.70)

## 2023-11-19 LAB — LACTIC ACID, PLASMA: Lactic Acid, Venous: 1.3 mmol/L (ref 0.5–1.9)

## 2023-11-19 LAB — MAGNESIUM: Magnesium: 2.2 mg/dL (ref 1.7–2.4)

## 2023-11-19 LAB — BRAIN NATRIURETIC PEPTIDE: B Natriuretic Peptide: 327.5 pg/mL — ABNORMAL HIGH (ref 0.0–100.0)

## 2023-11-19 MED ORDER — POTASSIUM CHLORIDE CRYS ER 10 MEQ PO TBCR
40.0000 meq | EXTENDED_RELEASE_TABLET | Freq: Once | ORAL | Status: AC
  Administered 2023-11-19: 40 meq via ORAL
  Filled 2023-11-19: qty 4

## 2023-11-19 MED ORDER — HEPARIN BOLUS VIA INFUSION
2500.0000 [IU] | Freq: Once | INTRAVENOUS | Status: AC
  Administered 2023-11-19: 2500 [IU] via INTRAVENOUS
  Filled 2023-11-19: qty 2500

## 2023-11-19 NOTE — Consult Note (Signed)
 NAME:  Tiffany English, MRN:  [id number], DOB:  Nov 01, 1985, LOS: 1 ADMISSION DATE:  11/18/2023, CONSULTATION DATE:  11/19/2023 REFERRING MD: Dr. Hildy Lowers -[facility], CHIEF COMPLAINT: Submassive PE  History of Present Illness:  Tiffany English is a 38 y.o. with a past medical history significant for GERD, anxiety, and depression who presented to the ED 5/9 for complaints of chest pain.  On admission to the ED patient was seen mildly tachypneic, tachycardic, hypertensive, and hypoxic requiring application of 2 L nasal cannula.  Lab work significant for glucose 146, WBC 11.1, high-sensitivity troponin 38 > 45.  No lactic acid or BNP collected.  Given concern for acute PE CTA chest obtained and revealed acute bilateral PE with CT evidence of right heart strain (RV/LV ratio 1.4) with faint area of groundglass in the right upper lobe pulmonary infarct.  Echocardiogram revealed EF 60 to 65% with no WMA evidence of interventricular septal flattening consistent with RV pressure overload positive McConnell sign. [facility] was consulted for admission. PCCM consulted for possible need of PE intervention.   Pertinent  Medical History  GERD, anxiety, and depression  Significant Hospital Events: Including procedures, antibiotic start and stop dates in addition to other pertinent events   5/9 presented with complaints of shortness of breath and chest pain, CTA positive for PE 5/10 echo positive for RV overload with positive McConnell sign, IR consulted for possible catheter directed lysis  Interim History / Subjective:  Continues to express exertional dyspnea.  No current chest pain  Objective    Blood pressure (!) 143/87, pulse (!) 107, temperature 97.6 F (36.4 C), temperature source Oral, resp. rate (!) 25, height 6\' 2"  (1.88 m), weight 94.6 kg, last menstrual period 11/04/2023, SpO2 96%.       No intake or output data in the 24 hours ending 11/19/23 1542 Filed Weights   11/18/23 2013  Weight: 94.6 kg     Examination: General: Well-appearing adult female sitting up in bed in no acute distress HEENT: [place]/AT, MM pink/moist, PERRL,  Neuro: Alert and oriented x 3, nonfocal CV: s1s2 regular rate and rhythm, no murmur, rubs, or gallops,  PULM: Clear to auscultation bilaterally, diminished bases, no increased work of breathing, on 2 L nasal cannula GI: soft, bowel sounds active in all 4 quadrants, non-tender, non-distended, tolerating oral diet Extremities: warm/dry, no edema  Skin: no rashes or lesions   Resolved Hospital Problem list     Assessment & Plan:  Acute bilateral PE with evidence of right heart strain .-Likely provoked in the setting of sedentary career and oral contraceptive pills -CTA chest obtained and revealed acute bilateral PE with CT evidence of right heart strain (RV/LV ratio 1.4) with faint area of groundglass in the right upper lobe pulmonary infarct.   -Echocardiogram revealed EF 60 to 65% with no WMA evidence of interventricular septal flattening consistent with RV pressure overload positive McConnell sign.  P: Consult interventional radiology for potential catheter directed lysis given ongoing symptoms of dyspnea and RV overload on echo Follow lower extremity Dopplers Continue heparin drip Check lactic and BNP No absolute or relative contraindications for systemic lytics if she were to decompensate   Best Practice (right click and "Reselect all SmartList Selections" daily)   Diet/type: Regular consistency (see orders) DVT prophylaxis systemic heparin Pressure ulcer(s): N/A GI prophylaxis: N/A Lines: N/A Foley:  N/A Code Status:  full code Last date of multidisciplinary goals of care discussion: Continue to update patient and family daily   Labs   CBC: Recent  Labs  Lab 11/18/23 2029 11/18/23 2300 11/19/23 0521  WBC 11.9* 11.7* 10.2  HGB 10.1* 9.8* 9.5*  HCT 34.6* 33.3* 32.1*  MCV 70.6* 70.4* 71.2*  PLT 339 320 300    Basic Metabolic  Panel: Recent Labs  Lab 11/18/23 2029 11/19/23 0521  NA 136 136  K 3.5 3.3*  CL 104 106  CO2 18* 17*  GLUCOSE 146* 105*  BUN 10 7  CREATININE 0.98 0.78  CALCIUM 9.1 8.6*  MG  --  2.2   GFR: Estimated Creatinine Clearance: 127.2 mL/min (by C-G formula based on SCr of 0.78 mg/dL). Recent Labs  Lab 11/18/23 2029 11/18/23 2300 11/19/23 0521  WBC 11.9* 11.7* 10.2    Liver Function Tests: No results for input(s): "AST", "ALT", "ALKPHOS", "BILITOT", "PROT", "ALBUMIN" in the last 168 hours. No results for input(s): "LIPASE", "AMYLASE" in the last 168 hours. No results for input(s): "AMMONIA" in the last 168 hours.  ABG No results found for: "PHART", "PCO2ART", "PO2ART", "HCO3", "TCO2", "ACIDBASEDEF", "O2SAT"   Coagulation Profile: No results for input(s): "INR", "PROTIME" in the last 168 hours.  Cardiac Enzymes: No results for input(s): "CKTOTAL", "CKMB", "CKMBINDEX", "TROPONINI" in the last 168 hours.  HbA1C: No results found for: "HGBA1C"  CBG: No results for input(s): "GLUCAP" in the last 168 hours.  Review of Systems:   Please see the history of present illness. All other systems reviewed and are negative   Past Medical History:  She,  has a past medical history of Anxiety, Depression, Gallbladder polyp, GERD (gastroesophageal reflux disease), IBS (irritable bowel syndrome), and Obesity.   Surgical History:  History reviewed. No pertinent surgical history.   Social History:   reports that she has never smoked. She does not have any smokeless tobacco history on file. She reports current alcohol use. She reports that she does not use drugs.   Family History:  Her family history includes Colon cancer in her unknown relative and unknown relative; Liver disease in an other family member; Prostate cancer in her unknown relative.   Allergies No Known Allergies   Home Medications  Prior to Admission medications   Medication Sig Start Date End Date Taking?  Authorizing Provider  albuterol (VENTOLIN HFA) 108 (90 Base) MCG/ACT inhaler 2 puffs every 4 (four) hours as needed for shortness of breath or wheezing. 11/14/23  Yes [provider]  buPROPion (WELLBUTRIN) 100 MG tablet Take 100 mg by mouth daily. 10/31/23  Yes [provider]  escitalopram (LEXAPRO) 20 MG tablet Take 20 mg by mouth daily.   Yes [provider]  lansoprazole (PREVACID) 30 MG capsule TAKE ONE CAPSULE BY MOUTH EVERY DAY 07/04/11  Yes Tessa Figures, MD  buPROPion ER (WELLBUTRIN SR) 100 MG 12 hr tablet Take 100 mg by mouth daily. Patient not taking: Reported on 11/19/2023    [provider]     Critical care time: NA  Konrad Hoak D. Harris, NP-C [facility] Personal contact information can be found on Amion  If no contact or response made please call [phone] 11/19/2023, 3:54 PM

## 2023-11-19 NOTE — Progress Notes (Signed)
 PROGRESS NOTE    Tiffany English  ZOX:[id number] DOB: 1986-01-13 DOA: 11/18/2023 PCP: Patient, No Pcp Per    Chief Complaint  Patient presents with   Chest Pain    Brief Narrative:  Patient is a pleasant 38 year old female history of anxiety and depression, GERD, on oral contraceptives, presented with progressive worsening shortness of breath.  Initially felt had a viral upper respiratory symptoms.  Due to progressive worsening shortness of breath patient presented to the ED, workup concerning for an acute bilateral PE on CT angiogram chest with RV/LV ratio of 1.49.  ED PA consulted PCCM and felt patient did not need to be in the ICU and recommended hospitalist admission.  Patient placed on IV heparin.  2D echo, lower extremity Dopplers ordered.   Assessment & Plan:   Principal Problem:   Acute pulmonary embolism (HCC) Active Problems:   Depression with anxiety   GERD   Microcytic anemia   Hypokalemia   #1 acute bilateral PE -Patient presented progressive worsening shortness of breath, CT angiogram chest done with acute bilateral PE with CT evidence of right heart strain consistent with at least submassive PE.  Moderate thromboembolic burden. - Likely secondary to oral contraceptive pills as patient denies any prior history of DVT or PE, no recent surgeries, no recent long travel. - 2D echo with a EF of 60 to 65%,NWMA, mild LVH, intraventricular septum flattening in systole and diastole consistent with right ventricular pressure and volume overload.  McConnell sign noted with normal apical function but free wall hypokinesis as can be seen in acute PE.  Right ventricular systolic function moderately reduced.  Right ventricular size mildly enlarged.  Mildly elevated pulmonary arterial systolic pressure.  Right ventricular systolic pressure 37.6 mmHg. - Lower extremity Dopplers pending. - Vitals stable. - Continue IV heparin for the next 24 to 48 hours and if continued stability could  likely transition to a DOAC. - Check ambulatory sats in the AM. - Due to acute bilateral PE, evidence of right ventricular strain on CT and 2D echo, elevated troponins will consult with critical care/pulmonary for further evaluation and recommendations. - Patient will need at least 3 to 6 months of anticoagulation as this is her first episode and may need repeat 2D echo in 3 to 4 months. - Supportive care.  2.  Depression/anxiety -Continue home regimen Wellbutrin, Lexapro.  3.  Microcytic anemia -Likely iron deficiency anemia in menstruating female. -Patient with no overt bleeding. -Follow H&H closely with anticoagulation. -Check an anemia panel. -Transfusion threshold hemoglobin < 8.  4.  Hypokalemia -Magnesium at 2.2. -Replete.  5.  GERD -PPI. -    DVT prophylaxis: Heparin drip Code Status: Full Family Communication: Updated patient.  No family at bedside. Disposition: Likely home when clinically stable, transition to oral DOAC hopefully in the next 48 hours.  Status is: Inpatient Remains inpatient appropriate because: Severity of illness   Consultants:  None  Procedures:  CT angiogram chest 11/18/2023 Lower extremity Dopplers pending 2D echo 11/19/2023   Antimicrobials:  Anti-infectives (From admission, onward)    None         Subjective: Patient sitting up on gurney.  States shortness of breath is improved since admission.  Denies any chest pain.  No abdominal pain.  Feeling anxious.  Denied any recent history of long car rides, no recent surgeries, no history of DVT or PEs.  Patient does endorse being on oral contraceptives.  Objective: Vitals:   11/19/23 1200 11/19/23 1315 11/19/23 1345 11/19/23 1408  BP: 129/89 (!) 154/94  (!) 143/87  Pulse: 90 (!) 107    Resp: (!) 23 (!) 25    Temp:   98 F (36.7 C) 97.6 F (36.4 C)  TempSrc:   Oral Oral  SpO2: 93% 96%    Weight:      Height:       No intake or output data in the 24 hours ending 11/19/23  1513 Filed Weights   11/18/23 2013  Weight: 94.6 kg    Examination:  General exam: Appears calm and comfortable  Respiratory system: Clear to auscultation. Respiratory effort normal. Cardiovascular system: Tachycardia.  No JVD, no murmurs rubs or gallops.  No lower extremity edema.  Gastrointestinal system: Abdomen is nondistended, soft and nontender. No organomegaly or masses felt. Normal bowel sounds heard. Central nervous system: Alert and oriented. No focal neurological deficits. Extremities: Symmetric 5 x 5 power. Skin: No rashes, lesions or ulcers Psychiatry: Judgement and insight appear normal. Mood & affect appropriate.     Data Reviewed: I have personally reviewed following labs and imaging studies  CBC: Recent Labs  Lab 11/18/23 2029 11/18/23 2300 11/19/23 0521  WBC 11.9* 11.7* 10.2  HGB 10.1* 9.8* 9.5*  HCT 34.6* 33.3* 32.1*  MCV 70.6* 70.4* 71.2*  PLT 339 320 300    Basic Metabolic Panel: Recent Labs  Lab 11/18/23 2029 11/19/23 0521  NA 136 136  K 3.5 3.3*  CL 104 106  CO2 18* 17*  GLUCOSE 146* 105*  BUN 10 7  CREATININE 0.98 0.78  CALCIUM 9.1 8.6*  MG  --  2.2    GFR: Estimated Creatinine Clearance: 127.2 mL/min (by C-G formula based on SCr of 0.78 mg/dL).  Liver Function Tests: No results for input(s): "AST", "ALT", "ALKPHOS", "BILITOT", "PROT", "ALBUMIN" in the last 168 hours.  CBG: No results for input(s): "GLUCAP" in the last 168 hours.   No results found for this or any previous visit (from the past 240 hours).       Radiology Studies: ECHOCARDIOGRAM COMPLETE Result Date: 11/19/2023    ECHOCARDIOGRAM REPORT   Patient Name:   Tiffany English Date of Exam: 11/19/2023 Medical Rec #:  [id number]      Height:       74.0 in Accession #:    [id number]     Weight:       208.6 lb Date of Birth:  1986-04-15       BSA:          2.213 m Patient Age:    37 years       BP:           139/87 mmHg Patient Gender: F              HR:           102 bpm.  Exam Location:  Inpatient Procedure: 2D Echo, Cardiac Doppler and Color Doppler (Both Spectral and Color            Flow Doppler were utilized during procedure). Indications:    Pulmonary Embolus I26.09  History:        Patient has no prior history of Echocardiogram examinations.  Sonographer:    Terrilee Few RCS Referring Phys: [id number] TIMOTHY S OPYD IMPRESSIONS  1. Left ventricular ejection fraction, by estimation, is 60 to 65%. The left ventricle has normal function. The left ventricle has no regional wall motion abnormalities. There is mild left ventricular hypertrophy. Left ventricular diastolic parameters were normal. There is the interventricular  septum is flattened in systole and diastole, consistent with right ventricular pressure and volume overload.  2. Poor RV visualization but on image 83, appears McConnell's sign with normal apical function but free wall hypokinesis, as can be seen in acute PE. Right ventricular systolic function is moderately reduced. The right ventricular size is mildly enlarged. There is mildly elevated pulmonary artery systolic pressure. The estimated right ventricular systolic pressure is 37.6 mmHg.  3. The mitral valve is normal in structure. Trivial mitral valve regurgitation.  4. The aortic valve was not well visualized. Aortic valve regurgitation is not visualized. No aortic stenosis is present.  5. Aortic dilatation noted. There is mild dilatation of the ascending aorta, measuring 39 mm.  6. The inferior vena cava is normal in size with greater than 50% respiratory variability, suggesting right atrial pressure of 3 mmHg. FINDINGS  Left Ventricle: Left ventricular ejection fraction, by estimation, is 60 to 65%. The left ventricle has normal function. The left ventricle has no regional wall motion abnormalities. The left ventricular internal cavity size was normal in size. There is  mild left ventricular hypertrophy. The interventricular septum is flattened in systole and  diastole, consistent with right ventricular pressure and volume overload. Left ventricular diastolic parameters were normal. Right Ventricle: The right ventricular size is mildly enlarged. Right vetricular wall thickness was not well visualized. Right ventricular systolic function is moderately reduced. There is mildly elevated pulmonary artery systolic pressure. The tricuspid  regurgitant velocity is 2.94 m/s, and with an assumed right atrial pressure of 3 mmHg, the estimated right ventricular systolic pressure is 37.6 mmHg. Left Atrium: Left atrial size was normal in size. Right Atrium: Right atrial size was normal in size. Pericardium: There is no evidence of pericardial effusion. Mitral Valve: The mitral valve is normal in structure. Trivial mitral valve regurgitation. Tricuspid Valve: The tricuspid valve is normal in structure. Tricuspid valve regurgitation is trivial. Aortic Valve: The aortic valve was not well visualized. Aortic valve regurgitation is not visualized. No aortic stenosis is present. Aortic valve peak gradient measures 11.2 mmHg. Pulmonic Valve: The pulmonic valve was not well visualized. Pulmonic valve regurgitation is not visualized. Aorta: The aortic root is normal in size and structure and aortic dilatation noted. There is mild dilatation of the ascending aorta, measuring 39 mm. Venous: The inferior vena cava is normal in size with greater than 50% respiratory variability, suggesting right atrial pressure of 3 mmHg. IAS/Shunts: The interatrial septum was not well visualized.  LEFT VENTRICLE PLAX 2D LVIDd:         5.20 cm   Diastology LVIDs:         3.00 cm   LV e' medial:    11.20 cm/s LV PW:         1.20 cm   LV E/e' medial:  6.4 LV IVS:        1.10 cm   LV e' lateral:   21.10 cm/s LVOT diam:     2.30 cm   LV E/e' lateral: 3.4 LV SV:         68 LV SV Index:   31 LVOT Area:     4.15 cm  RIGHT VENTRICLE             IVC RV S prime:     10.80 cm/s  IVC diam: 1.30 cm TAPSE (M-mode): 2.4 cm LEFT  ATRIUM             Index        RIGHT  ATRIUM           Index LA diam:        3.30 cm 1.49 cm/m   RA Area:     10.90 cm LA Vol (A2C):   58.2 ml 26.31 ml/m  RA Volume:   21.60 ml  9.76 ml/m LA Vol (A4C):   44.6 ml 20.16 ml/m LA Biplane Vol: 53.3 ml 24.09 ml/m  AORTIC VALVE AV Area (Vmax): 2.64 cm AV Vmax:        167.00 cm/s AV Peak Grad:   11.2 mmHg LVOT Vmax:      106.00 cm/s LVOT Vmean:     69.200 cm/s LVOT VTI:       0.163 m  AORTA Ao Root diam: 3.70 cm Ao Asc diam:  3.90 cm MITRAL VALVE               TRICUSPID VALVE MV Area (PHT): 5.13 cm    TR Peak grad:   34.6 mmHg MV Decel Time: 148 msec    TR Vmax:        294.00 cm/s MV E velocity: 72.10 cm/s MV A velocity: 93.20 cm/s  SHUNTS MV E/A ratio:  0.77        Systemic VTI:  0.16 m                            Systemic Diam: 2.30 cm Carson Clara MD Electronically signed by Carson Clara MD Signature Date/Time: 11/19/2023/10:25:26 AM    Final    CT Angio Chest PE W and/or Wo Contrast Result Date: 11/18/2023 CLINICAL DATA:  Pulmonary embolism (PE) suspected, high prob Chest pain. EXAM: CT ANGIOGRAPHY CHEST WITH CONTRAST TECHNIQUE: Multidetector CT imaging of the chest was performed using the standard protocol during bolus administration of intravenous contrast. Multiplanar CT image reconstructions and MIPs were obtained to evaluate the vascular anatomy. RADIATION DOSE REDUCTION: This exam was performed according to the departmental dose-optimization program which includes automated exposure control, adjustment of the mA and/or kV according to patient size and/or use of iterative reconstruction technique. CONTRAST:  75mL OMNIPAQUE IOHEXOL 350 MG/ML SOLN COMPARISON:  None Available. FINDINGS: Cardiovascular: Acute bilateral pulmonary emboli. There are filling defects involving the segmental branches in the right upper lobe, right middle lobe, lingular, and both lower lobe pulmonary arteries. Thromboembolic burden is moderate. There is right heart  strain. RV to LV ratio is 1.49. Main pulmonary artery is dilated 4.4 cm. Overall heart size is normal. Common origin of the brachiocephalic and left common carotid artery, variant arch anatomy. Mediastinum/Nodes: 16 mm anterior paratracheal node series 5, image 42. Additional shotty mediastinal lymph nodes. No enlarged hilar nodes. Unremarkable esophagus. Lungs/Pleura: Faint area of ground-glass in the right upper lobe series 6, image 52. No confluent consolidation. No pleural effusion. No features of pulmonary edema. 4 mm right lower lobe pulmonary nodule series 6, image 96. Upper Abdomen: Low-density lesions in the liver are too small to characterize but typically cysts. No acute upper abdominal findings. Musculoskeletal: Mild scoliosis and degenerative change in the spine. There are no acute or suspicious osseous abnormalities. Review of the MIP images confirms the above findings. IMPRESSION: 1. Positive for acute bilateral pulmonary emboli with CT evidence of right heart strain (RV/LV Ratio = 1.49) consistent with at least submassive (intermediate risk) PE. Moderate thromboembolic burden. The presence of right heart strain has been associated with an increased risk of morbidity and mortality. Please refer to the "Code PE  Focused" order set in EPIC. 2. Dilated main pulmonary artery. 3. Faint area of ground-glass in the right upper lobe, nonspecific. This may represent an area of inflammation or developing pulmonary infarct. 4. A 4 mm right lower lobe pulmonary nodule.Per Fleischner Society Guidelines, no routine follow-up imaging is recommended. These guidelines do not apply to immunocompromised patients and patients with cancer. Follow up in patients with significant comorbidities as clinically warranted. For lung cancer screening, adhere to Lung-RADS guidelines. Reference: Radiology. 2017; 284(1):228-43. Critical Value/emergent results were called by telephone at the time of interpretation on 11/18/2023 at 10:24  pm to provider Cherre Cornish, who verbally acknowledged these results. Electronically Signed   By: Chadwick Colonel M.D.   On: 11/18/2023 22:27        Scheduled Meds:  buPROPion  100 mg Oral Daily   escitalopram  20 mg Oral Daily   pantoprazole  40 mg Oral Daily   sodium chloride flush  3 mL Intravenous Q12H   Continuous Infusions:  heparin 1,750 Units/hr (11/19/23 0914)     LOS: 1 day    Time spent: 40 minutes    Hilda Lovings, MD [facility]   To contact the attending provider between 7A-7P or the covering provider during after hours 7P-7A, please log into the web site [email or web address] and access using universal [facility] password for that web site. If you do not have the password, please call the hospital operator.  11/19/2023, 3:13 PM

## 2023-11-19 NOTE — Progress Notes (Signed)
 Echocardiogram 2D Echocardiogram has been performed.  Tiffany English 11/19/2023, 9:49 AM

## 2023-11-19 NOTE — Progress Notes (Signed)
 ANTICOAGULATION CONSULT NOTE  Pharmacy Consult for Heparin Indication: pulmonary embolus  No Known Allergies  Patient Measurements: Height: 6\' 2"  (188 cm) Weight: 94.6 kg (208 lb 8.9 oz) IBW/kg (Calculated) : 77.7 Heparin Dosing Weight: 94.6 kg  Vital Signs: Temp: 97.6 F (36.4 C) (05/10 1408) Temp Source: Oral (05/10 1408) BP: 143/87 (05/10 1408) Pulse Rate: 107 (05/10 1315)  Labs: Recent Labs    11/18/23 2029 11/18/23 2217 11/18/23 2300 11/19/23 0521 11/19/23 1350  HGB 10.1*  --  9.8* 9.5*  --   HCT 34.6*  --  33.3* 32.1*  --   PLT 339  --  320 300  --   HEPARINUNFRC  --   --   --  0.11* 0.14*  CREATININE 0.98  --   --  0.78  --   TROPONINIHS 38* 45*  --   --   --     Estimated Creatinine Clearance: 127.2 mL/min (by C-G formula based on SCr of 0.78 mg/dL).   Medical History: Past Medical History:  Diagnosis Date   Anxiety    Depression    Gallbladder polyp    GERD (gastroesophageal reflux disease)    IBS (irritable bowel syndrome)    Obesity     Medications:  Medications Prior to Admission  Medication Sig Dispense Refill Last Dose/Taking   albuterol (VENTOLIN HFA) 108 (90 Base) MCG/ACT inhaler 2 puffs every 4 (four) hours as needed for shortness of breath or wheezing.   Taking As Needed   buPROPion (WELLBUTRIN) 100 MG tablet Take 100 mg by mouth daily.   11/18/2023 Morning   escitalopram (LEXAPRO) 20 MG tablet Take 20 mg by mouth daily.   11/18/2023 Morning   lansoprazole (PREVACID) 30 MG capsule TAKE ONE CAPSULE BY MOUTH EVERY DAY 30 capsule 0 11/18/2023 Morning   buPROPion ER (WELLBUTRIN SR) 100 MG 12 hr tablet Take 100 mg by mouth daily. (Patient not taking: Reported on 11/19/2023)   Not Taking   Scheduled:   buPROPion  100 mg Oral Daily   escitalopram  20 mg Oral Daily   pantoprazole  40 mg Oral Daily   sodium chloride flush  3 mL Intravenous Q12H   Infusions:   heparin 1,750 Units/hr (11/19/23 0914)   PRN: acetaminophen **OR** acetaminophen,  HYDROmorphone (DILAUDID) injection, hydrOXYzine, ondansetron **OR** ondansetron (ZOFRAN) IV, oxyCODONE, polyethylene glycol, traZODone  Assessment: Tiffany English is a 38 y.o. year old female admitted on 11/18/2023 with concern for PE. CTA with bilateral PE and R heart strain noted. No anticoagulation prior to admission on dispense history. Pharmacy consulted to dose heparin.   HL 0.11 >> re-bolused 2500 units and increased to 1750 units/hr HL 0.14 - subtherapeutic  Hgb 9.8>9.5; plt 320>300  Goal of Therapy:  Heparin level 0.3-0.7 units/ml Monitor platelets by anticoagulation protocol: Yes   Plan:  Give IV heparin 2500 units bolus x 1 Increase heparin infusion to 2100 units/hr Check anti-Xa level at 6 and daily while on heparin Continue to monitor H&H and platelets  Dionicio Fray, PharmD, BCPS 11/19/2023 3:46 PM ED Clinical Pharmacist -  [phone]

## 2023-11-19 NOTE — Progress Notes (Signed)
 PHARMACY - ANTICOAGULATION CONSULT NOTE  Pharmacy Consult for IV heparin Indication: pulmonary embolus  No Known Allergies  Patient Measurements: Height: 6\' 2"  (188 cm) Weight: 94.6 kg (208 lb 8.9 oz) IBW/kg (Calculated) : 77.7 HEPARIN DW (KG): 94.6  Vital Signs: Temp: 97.6 F (36.4 C) (05/10 0600) Temp Source: Oral (05/10 0600) BP: 139/87 (05/10 0600) Pulse Rate: 92 (05/10 0600)  Labs: Recent Labs    11/18/23 2029 11/18/23 2217 11/18/23 2300 11/19/23 0521  HGB 10.1*  --  9.8* 9.5*  HCT 34.6*  --  33.3* 32.1*  PLT 339  --  320 300  HEPARINUNFRC  --   --   --  0.11*  CREATININE 0.98  --   --   --   TROPONINIHS 38* 45*  --   --     Estimated Creatinine Clearance: 103.8 mL/min (by C-G formula based on SCr of 0.98 mg/dL).   Medical History: Past Medical History:  Diagnosis Date   Anxiety    Depression    Gallbladder polyp    GERD (gastroesophageal reflux disease)    IBS (irritable bowel syndrome)    Obesity    Assessment: Tiffany English is a 38 y.o. year old female admitted on 11/18/2023 with concern for PE. CTA with bilateral PE and R heart strain noted. No anticoagulation prior to admission on dispense history. Pharmacy consulted to dose heparin.  Heparin level 0.11, subtherapeutic  No issues with infusion overnight or s/sx bleeding per RN.   Goal of Therapy:  Heparin level 0.3-0.7 units/ml Monitor platelets by anticoagulation protocol: Yes   Plan:  Heparin 2500 units x 1 as bolus and increase heparin infusion at 1750 units/hr 6 heparin level  Daily heparin level, CBC, and monitoring for bleeding F/u plans for anticoagulation   Thank you for allowing pharmacy to participate in this patient's care.  Fonda Hymen, PharmD Emergency Medicine Clinical Pharmacist 11/19/2023,6:18 AM

## 2023-11-19 NOTE — Progress Notes (Addendum)
 ANTICOAGULATION CONSULT NOTE  Pharmacy Consult for Heparin Indication: pulmonary embolus  No Known Allergies  Patient Measurements: Height: 6\' 2"  (188 cm) Weight: 94.6 kg (208 lb 8.9 oz) IBW/kg (Calculated) : 77.7 Heparin Dosing Weight: 94.6 kg  Vital Signs: Temp: 97.6 F (36.4 C) (05/10 1408) Temp Source: Oral (05/10 1408) BP: 143/87 (05/10 1408) Pulse Rate: 107 (05/10 1315)  Labs: Recent Labs    11/18/23 2029 11/18/23 2217 11/18/23 2300 11/19/23 0521 11/19/23 1350 11/19/23 2147  HGB 10.1*  --  9.8* 9.5*  --   --   HCT 34.6*  --  33.3* 32.1*  --   --   PLT 339  --  320 300  --   --   HEPARINUNFRC  --   --   --  0.11* 0.14* 0.16*  CREATININE 0.98  --   --  0.78  --   --   TROPONINIHS 38* 45*  --   --   --   --     Estimated Creatinine Clearance: 127.2 mL/min (by C-G formula based on SCr of 0.78 mg/dL).   Medical History: Past Medical History:  Diagnosis Date   Anxiety    Depression    Gallbladder polyp    GERD (gastroesophageal reflux disease)    IBS (irritable bowel syndrome)    Obesity     Medications:  Medications Prior to Admission  Medication Sig Dispense Refill Last Dose/Taking   albuterol (VENTOLIN HFA) 108 (90 Base) MCG/ACT inhaler 2 puffs every 4 (four) hours as needed for shortness of breath or wheezing.   Taking As Needed   buPROPion (WELLBUTRIN) 100 MG tablet Take 100 mg by mouth daily.   11/18/2023 Morning   escitalopram (LEXAPRO) 20 MG tablet Take 20 mg by mouth daily.   11/18/2023 Morning   lansoprazole (PREVACID) 30 MG capsule TAKE ONE CAPSULE BY MOUTH EVERY DAY 30 capsule 0 11/18/2023 Morning   buPROPion ER (WELLBUTRIN SR) 100 MG 12 hr tablet Take 100 mg by mouth daily. (Patient not taking: Reported on 11/19/2023)   Not Taking   Scheduled:   buPROPion  100 mg Oral Daily   escitalopram  20 mg Oral Daily   pantoprazole  40 mg Oral Daily   sodium chloride flush  3 mL Intravenous Q12H   Infusions:   heparin 2,100 Units/hr (11/19/23 2126)    PRN: acetaminophen **OR** acetaminophen, HYDROmorphone (DILAUDID) injection, hydrOXYzine, ondansetron **OR** ondansetron (ZOFRAN) IV, oxyCODONE, polyethylene glycol, traZODone  Assessment: Tiffany English is a 38 y.o. year old female admitted on 11/18/2023 with concern for PE. CTA with bilateral PE and R heart strain noted. No anticoagulation prior to admission on dispense history. Pharmacy consulted to dose heparin.   -heparin level- 0.16 after heparin bolus and increase to 2100 units/hr  Goal of Therapy:  Heparin level 0.3-0.7 units/ml Monitor platelets by anticoagulation protocol: Yes   Plan:  Repeat IV heparin 2500 units bolus x 1 Increase heparin infusion to 2450 units/hr Check anti-Xa level at 6 and daily while on heparin Continue to monitor H&H and platelets  Baxter Limber, PharmD Clinical Pharmacist **Pharmacist phone directory can now be found on [email or web address] ([email or web address]).  Listed under [facility].

## 2023-11-20 ENCOUNTER — Inpatient Hospital Stay (HOSPITAL_COMMUNITY)

## 2023-11-20 DIAGNOSIS — Z86711 Personal history of pulmonary embolism: Secondary | ICD-10-CM

## 2023-11-20 DIAGNOSIS — I2699 Other pulmonary embolism without acute cor pulmonale: Secondary | ICD-10-CM | POA: Diagnosis not present

## 2023-11-20 LAB — IRON AND TIBC
Iron: 21 ug/dL — ABNORMAL LOW (ref 28–170)
Saturation Ratios: 4 % — ABNORMAL LOW (ref 10.4–31.8)
TIBC: 518 ug/dL — ABNORMAL HIGH (ref 250–450)
UIBC: 497 ug/dL

## 2023-11-20 LAB — BASIC METABOLIC PANEL WITH GFR
Anion gap: 13 (ref 5–15)
BUN: 11 mg/dL (ref 6–20)
CO2: 19 mmol/L — ABNORMAL LOW (ref 22–32)
Calcium: 9 mg/dL (ref 8.9–10.3)
Chloride: 107 mmol/L (ref 98–111)
Creatinine, Ser: 0.89 mg/dL (ref 0.44–1.00)
GFR, Estimated: >60 mL/min (ref >60)
Glucose, Bld: 113 mg/dL — ABNORMAL HIGH (ref 70–99)
Potassium: 3.9 mmol/L (ref 3.5–5.1)
Sodium: 139 mmol/L (ref 135–145)

## 2023-11-20 LAB — CBC
HCT: 33.8 % — ABNORMAL LOW (ref 36.0–46.0)
Hemoglobin: 9.8 g/dL — ABNORMAL LOW (ref 12.0–15.0)
MCH: 21.1 pg — ABNORMAL LOW (ref 26.0–34.0)
MCHC: 29 g/dL — ABNORMAL LOW (ref 30.0–36.0)
MCV: 72.7 fL — ABNORMAL LOW (ref 80.0–100.0)
Platelets: 348 10*3/uL (ref 150–400)
RBC: 4.65 MIL/uL (ref 3.87–5.11)
RDW: 17.6 % — ABNORMAL HIGH (ref 11.5–15.5)
WBC: 10.1 10*3/uL (ref 4.0–10.5)
nRBC: 0 % (ref 0.0–0.2)

## 2023-11-20 LAB — VITAMIN B12: Vitamin B-12: 122 pg/mL — ABNORMAL LOW (ref 180–914)

## 2023-11-20 LAB — FERRITIN: Ferritin: 8 ng/mL — ABNORMAL LOW (ref 11–307)

## 2023-11-20 LAB — HEPARIN LEVEL (UNFRACTIONATED): Heparin Unfractionated: 0.44 [IU]/mL (ref 0.30–0.70)

## 2023-11-20 LAB — FOLATE: Folate: 17.3 ng/mL (ref >5.9)

## 2023-11-20 MED ORDER — APIXABAN 5 MG PO TABS
10.0000 mg | ORAL_TABLET | Freq: Two times a day (BID) | ORAL | Status: DC
  Administered 2023-11-21: 10 mg via ORAL
  Filled 2023-11-20: qty 2

## 2023-11-20 MED ORDER — VITAMIN B-12 1000 MCG PO TABS: 1000.0000 ug | ORAL_TABLET | Freq: Every day | ORAL | Status: DC

## 2023-11-20 MED ORDER — APIXABAN 5 MG PO TABS: 5.0000 mg | ORAL_TABLET | Freq: Two times a day (BID) | ORAL | Status: DC

## 2023-11-20 MED ORDER — ENOXAPARIN SODIUM 100 MG/ML IJ SOSY
90.0000 mg | PREFILLED_SYRINGE | Freq: Two times a day (BID) | INTRAMUSCULAR | Status: AC
Start: 2023-11-20 — End: 2023-11-20
  Administered 2023-11-20 (×2): 90 mg via SUBCUTANEOUS
  Filled 2023-11-20 (×2): qty 0.9

## 2023-11-20 MED ORDER — FOLIC ACID 1 MG PO TABS
1.0000 mg | ORAL_TABLET | Freq: Every day | ORAL | Status: DC
  Administered 2023-11-20 – 2023-11-21 (×2): 1 mg via ORAL
  Filled 2023-11-20 (×2): qty 1

## 2023-11-20 MED ORDER — CYANOCOBALAMIN 1000 MCG/ML IJ SOLN
1000.0000 ug | Freq: Every day | INTRAMUSCULAR | Status: DC
  Administered 2023-11-20 – 2023-11-21 (×2): 1000 ug via SUBCUTANEOUS
  Filled 2023-11-20 (×2): qty 1

## 2023-11-20 MED ORDER — FERROUS SULFATE 325 (65 FE) MG PO TABS
325.0000 mg | ORAL_TABLET | Freq: Two times a day (BID) | ORAL | Status: DC
  Administered 2023-11-20 – 2023-11-21 (×3): 325 mg via ORAL
  Filled 2023-11-20 (×3): qty 1

## 2023-11-20 NOTE — Plan of Care (Signed)
 Progressing

## 2023-11-20 NOTE — Progress Notes (Addendum)
 ANTICOAGULATION CONSULT NOTE  Pharmacy Consult for Heparin Indication: pulmonary embolus  No Known Allergies  Patient Measurements: Height: 6\' 2"  (188 cm) Weight: 94.6 kg (208 lb 8.9 oz) IBW/kg (Calculated) : 77.7 Heparin Dosing Weight: 94.6 kg  Vital Signs: Temp: 98.3 F (36.8 C) (05/11 0325) Temp Source: Oral (05/11 0325) BP: 121/98 (05/11 0325) Pulse Rate: 102 (05/11 0325)  Labs: Recent Labs    11/18/23 2029 11/18/23 2217 11/18/23 2300 11/18/23 2300 11/19/23 0521 11/19/23 1350 11/19/23 2147 11/20/23 0638  HGB 10.1*  --  9.8*  --  9.5*  --   --  9.8*  HCT 34.6*  --  33.3*  --  32.1*  --   --  33.8*  PLT 339  --  320  --  300  --   --  348  HEPARINUNFRC  --   --   --    < > 0.11* 0.14* 0.16* 0.44  CREATININE 0.98  --   --   --  0.78  --   --   --   TROPONINIHS 38* 45*  --   --   --   --   --   --    < > = values in this interval not displayed.    Estimated Creatinine Clearance: 127.2 mL/min (by C-G formula based on SCr of 0.78 mg/dL).   Medical History: Past Medical History:  Diagnosis Date   Anxiety    Depression    Gallbladder polyp    GERD (gastroesophageal reflux disease)    IBS (irritable bowel syndrome)    Obesity     Medications:  Medications Prior to Admission  Medication Sig Dispense Refill Last Dose/Taking   albuterol (VENTOLIN HFA) 108 (90 Base) MCG/ACT inhaler 2 puffs every 4 (four) hours as needed for shortness of breath or wheezing.   Taking As Needed   buPROPion (WELLBUTRIN) 100 MG tablet Take 100 mg by mouth daily.   11/18/2023 Morning   escitalopram (LEXAPRO) 20 MG tablet Take 20 mg by mouth daily.   11/18/2023 Morning   lansoprazole (PREVACID) 30 MG capsule TAKE ONE CAPSULE BY MOUTH EVERY DAY 30 capsule 0 11/18/2023 Morning   buPROPion ER (WELLBUTRIN SR) 100 MG 12 hr tablet Take 100 mg by mouth daily. (Patient not taking: Reported on 11/19/2023)   Not Taking   Scheduled:   buPROPion  100 mg Oral Daily   escitalopram  20 mg Oral Daily    pantoprazole  40 mg Oral Daily   sodium chloride flush  3 mL Intravenous Q12H   Infusions:   heparin 2,450 Units/hr (11/19/23 2255)   PRN: acetaminophen **OR** acetaminophen, HYDROmorphone (DILAUDID) injection, hydrOXYzine, ondansetron **OR** ondansetron (ZOFRAN) IV, oxyCODONE, polyethylene glycol, traZODone  Assessment: Tiffany English is a 38 y.o. year old female admitted on 11/18/2023 with concern for PE. CTA with bilateral PE and R heart strain noted. No anticoagulation prior to admission on dispense history. Pharmacy consulted to dose heparin.   This morning, heparin level 0.44 (therapeutic) s/p heparin 2500 units bolus x 1 and increase in rate at 2450 units/hour. CBC okay - hgb 9.8 and plts 348. No issues reported.   Goal of Therapy:  Heparin level 0.3-0.7 units/ml Monitor platelets by anticoagulation protocol: Yes   Plan:  Continue heparin infusion at 2450 units/hr Check anti-Xa level and CBC daily while on heparin Continue to monitor H&H and platelets F/u plans to transition to a DOAC   Adaline Ada, PharmD PGY1 Pharmacy Resident 11/20/2023 7:30 AM  ========================================================================================================= ADDENDUM 11/20/2023  1610   Change in treatment plans per attending.   Plan:  - Enoxaparin 90 mg q12h (1 mg/kg SQ q12h) for one day          - Give enoxaparin at the time of heparin discontinuation - Begin Eliquis 10 mg PO BID for 7 days followed by 5 mg PO BID on 5/12          - Start Eliquis when next dose of enoxaparin was due tomorrow in AM, discontinue enoxaparin

## 2023-11-20 NOTE — Progress Notes (Signed)
 BLE venous duplex has been completed.  Preliminary results given to Dr. Zelda Hickman.   Results can be found under chart review under CV PROC. 11/20/2023 3:07 PM Anelia Carriveau RVT, RDMS

## 2023-11-20 NOTE — Discharge Instructions (Addendum)
 Information on my medicine - ELIQUIS (apixaban)  This medication education was reviewed with me or my healthcare representative as part of my discharge preparation.   Why was Eliquis prescribed for you? Eliquis was prescribed to treat blood clots that may have been found in the veins of your legs (deep vein thrombosis) or in your lungs (pulmonary embolism) and to reduce the risk of them occurring again.  What do You need to know about Eliquis ? The starting dose is 10 mg (two 5 mg tablets) taken TWICE daily for the FIRST SEVEN (7) DAYS, then on 11/28/2023 the dose is reduced to ONE 5 mg tablet taken TWICE daily.  Eliquis may be taken with or without food.   Try to take the dose about the same time in the morning and in the evening. If you have difficulty swallowing the tablet whole please discuss with your pharmacist how to take the medication safely.  Take Eliquis exactly as prescribed and DO NOT stop taking Eliquis without talking to the doctor who prescribed the medication.  Stopping may increase your risk of developing a new blood clot.  Refill your prescription before you run out.  After discharge, you should have regular check-up appointments with your healthcare provider that is prescribing your Eliquis.    What do you do if you miss a dose? If a dose of ELIQUIS is not taken at the scheduled time, take it as soon as possible on the same day and twice-daily administration should be resumed. The dose should not be doubled to make up for a missed dose.  Important Safety Information A possible side effect of Eliquis is bleeding. You should call your healthcare provider right away if you experience any of the following: Bleeding from an injury or your nose that does not stop. Unusual colored urine (red or dark brown) or unusual colored stools (red or black). Unusual bruising for unknown reasons. A serious fall or if you hit your head (even if there is no bleeding).  Some  medicines may interact with Eliquis and might increase your risk of bleeding or clotting while on Eliquis. To help avoid this, consult your healthcare provider or pharmacist prior to using any new prescription or non-prescription medications, including herbals, vitamins, non-steroidal anti-inflammatory drugs (NSAIDs) and supplements.  This website has more information on Eliquis (apixaban): [email or web address].com/eliquis/home      Follow with Primary MD or the recommended [place] wellness clinic in 7 days, follow-up with your [facility] physician within a week and the recommended pulmonary physician in 1 to 2 weeks.  Get CBC, CMP, 2 view Chest X ray -  checked next visit with your primary MD   Activity: As tolerated with Full fall precautions use walker/cane & assistance as needed  Disposition Home    Diet: Heart Healthy    Special Instructions: If you have smoked or chewed Tobacco  in the last 2 yrs please stop smoking, stop any regular Alcohol  and or any Recreational drug use.  On your next visit with your primary care physician please Get Medicines reviewed and adjusted.  Please request your Prim.MD to go over all Hospital Tests and Procedure/Radiological results at the follow up, please get all Hospital records sent to your Prim MD by signing hospital release before you go home.  If you experience worsening of your admission symptoms, develop shortness of breath, life threatening emergency, suicidal or homicidal thoughts you must seek medical attention immediately by calling 911 or calling your MD immediately  if  symptoms less severe.  You Must read complete instructions/literature along with all the possible adverse reactions/side effects for all the Medicines you take and that have been prescribed to you. Take any new Medicines after you have completely understood and accpet all the possible adverse reactions/side effects.   Do not drive when taking Pain medications.  Do not take more  than prescribed Pain, Sleep and Anxiety Medications  Wear Seat belts while driving.

## 2023-11-20 NOTE — Progress Notes (Signed)
 PROGRESS NOTE                                                                                                                                                                                                             Tiffany English Demographics:    Tiffany English, is a 38 y.o. female, DOB - [birth year], WUJ:[id number]  Outpatient Primary MD for the Tiffany English is Tiffany English, No Pcp Per    LOS - 2  Admit date - 11/18/2023    Chief Complaint  Tiffany English presents with   Chest Pain       Brief Narrative (HPI from H&P)   38 year old female history of anxiety and depression, GERD, on oral contraceptives, presented with progressive worsening shortness of breath. Initially felt had a viral upper respiratory symptoms. Due to progressive worsening shortness of breath Tiffany English presented to the ED, workup concerning for an acute bilateral PE on CT angiogram chest with RV/LV ratio of 1.49. ED PA consulted PCCM and felt Tiffany English did not need to be in the ICU and recommended hospitalist admission. Tiffany English placed on IV heparin. 2D echo, lower extremity Dopplers ordered.    Subjective:    [name] today has, No headache, No chest pain, No abdominal pain - No Nausea, No new weakness tingling or numbness, no SOB   Assessment  & Plan :    #1 Acute bilateral PE -Tiffany English presented progressive worsening shortness of breath, CT angiogram chest done with acute bilateral PE with CT evidence of right heart strain consistent with at least submassive PE.  Has underlying obesity and oral contraceptive pill use, requested to quit using oral contraception.  2D echo confirms RV strain and hypokinesis, pending lower extremity venous duplex.  Has been on heparin for 2 days will transition to Lovenox today and eventually if stable then to Eliquis on 11/21/2023.  Advance activity, at rest in daytime on room air, does have likely undiagnosed OSA.  Outpatient sleep study.   Currently symptom-free.   2.  Depression/anxiety -Continue home regimen Wellbutrin, Lexapro.   3.  Microcytic anemia with evidence of iron, folic acid and B12 deficiency -Likely iron deficiency anemia in menstruating female. -Tiffany English with no overt bleeding. - She has been placed on iron and folic acid, also had low B12, replace.   4.  Hypokalemia - Replaced   5.  GERD -PPI.  6.  Obesity.  BMI at least 35.  Follow-up with PCP for weight loss.         Condition - Fair  Family Communication  :  None  Code Status :  Full  Consults  :  PCCM  PUD Prophylaxis : PPI   Procedures  :           Disposition Plan  :    Status is: Inpatient   DVT Prophylaxis  :  Hep gtt   Lab Results  Component Value Date   PLT 348 11/20/2023    Diet :  Diet Order             Diet regular Room service appropriate? Yes; Fluid consistency: Thin  Diet effective now                    Inpatient Medications  Scheduled Meds:  buPROPion  100 mg Oral Daily   cyanocobalamin  1,000 mcg Subcutaneous Daily   [START ON 11/23/2023] vitamin B-12  1,000 mcg Oral Daily   escitalopram  20 mg Oral Daily   ferrous sulfate  325 mg Oral BID WC   folic acid  1 mg Oral Daily   pantoprazole  40 mg Oral Daily   sodium chloride flush  3 mL Intravenous Q12H   Continuous Infusions:  heparin 2,450 Units/hr (11/19/23 2255)   PRN Meds:.acetaminophen **OR** acetaminophen, HYDROmorphone (DILAUDID) injection, hydrOXYzine, ondansetron **OR** ondansetron (ZOFRAN) IV, oxyCODONE, polyethylene glycol, traZODone  Antibiotics  :    Anti-infectives (From admission, onward)    None         Objective:   Vitals:   11/19/23 1345 11/19/23 1408 11/19/23 2310 11/20/23 0325  BP:  (!) 143/87 (!) 142/91 (!) 121/98  Pulse:   98 (!) 102  Resp:   20 18  Temp: 98 F (36.7 C) 97.6 F (36.4 C) 98.1 F (36.7 C) 98.3 F (36.8 C)  TempSrc: Oral Oral Oral Oral  SpO2:   90% 93%  Weight:      Height:         Wt Readings from Last 3 Encounters:  11/18/23 94.6 kg    No intake or output data in the 24 hours ending 11/20/23 0818   Physical Exam  Awake Alert, No new F.N deficits, Normal affect [place].AT,PERRAL Supple Neck, No JVD,   Symmetrical Chest wall movement, Good air movement bilaterally, CTAB RRR,No Gallops,Rubs or new Murmurs,  +ve B.Sounds, Abd Soft, No tenderness,   No Cyanosis, Clubbing or edema       Data Review:    Recent Labs  Lab 11/18/23 2029 11/18/23 2300 11/19/23 0521 11/20/23 0638  WBC 11.9* 11.7* 10.2 10.1  HGB 10.1* 9.8* 9.5* 9.8*  HCT 34.6* 33.3* 32.1* 33.8*  PLT 339 320 300 348  MCV 70.6* 70.4* 71.2* 72.7*  MCH 20.6* 20.7* 21.1* 21.1*  MCHC 29.2* 29.4* 29.6* 29.0*  RDW 17.5* 17.2* 17.4* 17.6*    Recent Labs  Lab 11/18/23 2029 11/19/23 0521 11/19/23 2147 11/20/23 0638  NA 136 136  --  139  K 3.5 3.3*  --  3.9  CL 104 106  --  107  CO2 18* 17*  --  19*  ANIONGAP 14 13  --  13  GLUCOSE 146* 105*  --  113*  BUN 10 7  --  11  CREATININE 0.98 0.78  --  0.89  LATICACIDVEN  --   --  1.3  --   BNP  --   --  327.5*  --   MG  --  2.2  --   --   CALCIUM 9.1 8.6*  --  9.0      Recent Labs  Lab 11/18/23 2029 11/19/23 0521 11/19/23 2147 11/20/23 1610  LATICACIDVEN  --   --  1.3  --   BNP  --   --  327.5*  --   MG  --  2.2  --   --   CALCIUM 9.1 8.6*  --  9.0    --------------------------------------------------------------------------------------------------------------- No results found for: "CHOL", "HDL", "LDLCALC", "LDLDIRECT", "TRIG", "CHOLHDL"  No results found for: "HGBA1C" No results for input(s): "TSH", "T4TOTAL", "FREET4", "T3FREE", "THYROIDAB" in the last 72 hours. Recent Labs    11/20/23 0638  VITAMINB12 122*  FOLATE 17.3  FERRITIN 8*  TIBC 518*  IRON 21*   ------------------------------------------------------------------------------------------------------------------ Cardiac Enzymes No results for input(s): "CKMB",  "TROPONINI", "MYOGLOBIN" in the last 168 hours.  Invalid input(s): "CK"  Micro Results No results found for this or any previous visit (from the past 240 hours).  Radiology Report ECHOCARDIOGRAM COMPLETE Result Date: 11/19/2023    ECHOCARDIOGRAM REPORT   Tiffany English Name:   DARNELLA ZINMAN Date of Exam: 11/19/2023 Medical Rec #:  [id number]      Height:       74.0 in Accession #:    [id number]     Weight:       208.6 lb Date of Birth:  1986-04-18       BSA:          2.213 m Tiffany English Age:    37 years       BP:           139/87 mmHg Tiffany English Gender: F              HR:           102 bpm. Exam Location:  Inpatient Procedure: 2D Echo, Cardiac Doppler and Color Doppler (Both Spectral and Color            Flow Doppler were utilized during procedure). Indications:    Pulmonary Embolus I26.09  History:        Tiffany English has no prior history of Echocardiogram examinations.  Sonographer:    Terrilee Few RCS Referring Phys: [id number] TIMOTHY S OPYD IMPRESSIONS  1. Left ventricular ejection fraction, by estimation, is 60 to 65%. The left ventricle has normal function. The left ventricle has no regional wall motion abnormalities. There is mild left ventricular hypertrophy. Left ventricular diastolic parameters were normal. There is the interventricular septum is flattened in systole and diastole, consistent with right ventricular pressure and volume overload.  2. Poor RV visualization but on image 83, appears McConnell's sign with normal apical function but free wall hypokinesis, as can be seen in acute PE. Right ventricular systolic function is moderately reduced. The right ventricular size is mildly enlarged. There is mildly elevated pulmonary artery systolic pressure. The estimated right ventricular systolic pressure is 37.6 mmHg.  3. The mitral valve is normal in structure. Trivial mitral valve regurgitation.  4. The aortic valve was not well visualized. Aortic valve regurgitation is not visualized. No aortic stenosis is  present.  5. Aortic dilatation noted. There is mild dilatation of the ascending aorta, measuring 39 mm.  6. The inferior vena cava is normal in size with greater than 50% respiratory variability, suggesting right atrial pressure of 3 mmHg. FINDINGS  Left Ventricle: Left ventricular ejection fraction, by estimation, is 60 to 65%. The left ventricle has  normal function. The left ventricle has no regional wall motion abnormalities. The left ventricular internal cavity size was normal in size. There is  mild left ventricular hypertrophy. The interventricular septum is flattened in systole and diastole, consistent with right ventricular pressure and volume overload. Left ventricular diastolic parameters were normal. Right Ventricle: The right ventricular size is mildly enlarged. Right vetricular wall thickness was not well visualized. Right ventricular systolic function is moderately reduced. There is mildly elevated pulmonary artery systolic pressure. The tricuspid  regurgitant velocity is 2.94 m/s, and with an assumed right atrial pressure of 3 mmHg, the estimated right ventricular systolic pressure is 37.6 mmHg. Left Atrium: Left atrial size was normal in size. Right Atrium: Right atrial size was normal in size. Pericardium: There is no evidence of pericardial effusion. Mitral Valve: The mitral valve is normal in structure. Trivial mitral valve regurgitation. Tricuspid Valve: The tricuspid valve is normal in structure. Tricuspid valve regurgitation is trivial. Aortic Valve: The aortic valve was not well visualized. Aortic valve regurgitation is not visualized. No aortic stenosis is present. Aortic valve peak gradient measures 11.2 mmHg. Pulmonic Valve: The pulmonic valve was not well visualized. Pulmonic valve regurgitation is not visualized. Aorta: The aortic root is normal in size and structure and aortic dilatation noted. There is mild dilatation of the ascending aorta, measuring 39 mm. Venous: The inferior vena  cava is normal in size with greater than 50% respiratory variability, suggesting right atrial pressure of 3 mmHg. IAS/Shunts: The interatrial septum was not well visualized.  LEFT VENTRICLE PLAX 2D LVIDd:         5.20 cm   Diastology LVIDs:         3.00 cm   LV e' medial:    11.20 cm/s LV PW:         1.20 cm   LV E/e' medial:  6.4 LV IVS:        1.10 cm   LV e' lateral:   21.10 cm/s LVOT diam:     2.30 cm   LV E/e' lateral: 3.4 LV SV:         68 LV SV Index:   31 LVOT Area:     4.15 cm  RIGHT VENTRICLE             IVC RV S prime:     10.80 cm/s  IVC diam: 1.30 cm TAPSE (M-mode): 2.4 cm LEFT ATRIUM             Index        RIGHT ATRIUM           Index LA diam:        3.30 cm 1.49 cm/m   RA Area:     10.90 cm LA Vol (A2C):   58.2 ml 26.31 ml/m  RA Volume:   21.60 ml  9.76 ml/m LA Vol (A4C):   44.6 ml 20.16 ml/m LA Biplane Vol: 53.3 ml 24.09 ml/m  AORTIC VALVE AV Area (Vmax): 2.64 cm AV Vmax:        167.00 cm/s AV Peak Grad:   11.2 mmHg LVOT Vmax:      106.00 cm/s LVOT Vmean:     69.200 cm/s LVOT VTI:       0.163 m  AORTA Ao Root diam: 3.70 cm Ao Asc diam:  3.90 cm MITRAL VALVE               TRICUSPID VALVE MV Area (PHT): 5.13 cm    TR Peak  grad:   34.6 mmHg MV Decel Time: 148 msec    TR Vmax:        294.00 cm/s MV E velocity: 72.10 cm/s MV A velocity: 93.20 cm/s  SHUNTS MV E/A ratio:  0.77        Systemic VTI:  0.16 m                            Systemic Diam: 2.30 cm Carson Clara MD Electronically signed by Carson Clara MD Signature Date/Time: 11/19/2023/10:25:26 AM    Final    CT Angio Chest PE W and/or Wo Contrast Result Date: 11/18/2023 CLINICAL DATA:  Pulmonary embolism (PE) suspected, high prob Chest pain. EXAM: CT ANGIOGRAPHY CHEST WITH CONTRAST TECHNIQUE: Multidetector CT imaging of the chest was performed using the standard protocol during bolus administration of intravenous contrast. Multiplanar CT image reconstructions and MIPs were obtained to evaluate the vascular anatomy.  RADIATION DOSE REDUCTION: This exam was performed according to the departmental dose-optimization program which includes automated exposure control, adjustment of the mA and/or kV according to Tiffany English size and/or use of iterative reconstruction technique. CONTRAST:  75mL OMNIPAQUE IOHEXOL 350 MG/ML SOLN COMPARISON:  None Available. FINDINGS: Cardiovascular: Acute bilateral pulmonary emboli. There are filling defects involving the segmental branches in the right upper lobe, right middle lobe, lingular, and both lower lobe pulmonary arteries. Thromboembolic burden is moderate. There is right heart strain. RV to LV ratio is 1.49. Main pulmonary artery is dilated 4.4 cm. Overall heart size is normal. Common origin of the brachiocephalic and left common carotid artery, variant arch anatomy. Mediastinum/Nodes: 16 mm anterior paratracheal node series 5, image 42. Additional shotty mediastinal lymph nodes. No enlarged hilar nodes. Unremarkable esophagus. Lungs/Pleura: Faint area of ground-glass in the right upper lobe series 6, image 52. No confluent consolidation. No pleural effusion. No features of pulmonary edema. 4 mm right lower lobe pulmonary nodule series 6, image 96. Upper Abdomen: Low-density lesions in the liver are too small to characterize but typically cysts. No acute upper abdominal findings. Musculoskeletal: Mild scoliosis and degenerative change in the spine. There are no acute or suspicious osseous abnormalities. Review of the MIP images confirms the above findings. IMPRESSION: 1. Positive for acute bilateral pulmonary emboli with CT evidence of right heart strain (RV/LV Ratio = 1.49) consistent with at least submassive (intermediate risk) PE. Moderate thromboembolic burden. The presence of right heart strain has been associated with an increased risk of morbidity and mortality. Please refer to the "Code PE Focused" order set in EPIC. 2. Dilated main pulmonary artery. 3. Faint area of ground-glass in the  right upper lobe, nonspecific. This may represent an area of inflammation or developing pulmonary infarct. 4. A 4 mm right lower lobe pulmonary nodule.Per Fleischner Society Guidelines, no routine follow-up imaging is recommended. These guidelines do not apply to immunocompromised patients and patients with cancer. Follow up in patients with significant comorbidities as clinically warranted. For lung cancer screening, adhere to Lung-RADS guidelines. Reference: Radiology. 2017; 284(1):228-43. Critical Value/emergent results were called by telephone at the time of interpretation on 11/18/2023 at 10:24 pm to provider Cherre Cornish, who verbally acknowledged these results. Electronically Signed   By: Chadwick Colonel M.D.   On: 11/18/2023 22:27     Signature  -   Lynnwood Sauer M.D on 11/20/2023 at 8:18 AM   -  To page go to [email or web address]

## 2023-11-21 ENCOUNTER — Other Ambulatory Visit (HOSPITAL_COMMUNITY): Payer: Self-pay

## 2023-11-21 ENCOUNTER — Telehealth: Payer: Self-pay | Admitting: Internal Medicine

## 2023-11-21 ENCOUNTER — Telehealth (HOSPITAL_COMMUNITY): Payer: Self-pay | Admitting: Pharmacy Technician

## 2023-11-21 DIAGNOSIS — I2699 Other pulmonary embolism without acute cor pulmonale: Secondary | ICD-10-CM | POA: Diagnosis not present

## 2023-11-21 DIAGNOSIS — I2609 Other pulmonary embolism with acute cor pulmonale: Secondary | ICD-10-CM | POA: Diagnosis not present

## 2023-11-21 LAB — CBC
HCT: 31 % — ABNORMAL LOW (ref 36.0–46.0)
Hemoglobin: 9 g/dL — ABNORMAL LOW (ref 12.0–15.0)
MCH: 20.5 pg — ABNORMAL LOW (ref 26.0–34.0)
MCHC: 29 g/dL — ABNORMAL LOW (ref 30.0–36.0)
MCV: 70.5 fL — ABNORMAL LOW (ref 80.0–100.0)
Platelets: 317 10*3/uL (ref 150–400)
RBC: 4.4 MIL/uL (ref 3.87–5.11)
RDW: 17.2 % — ABNORMAL HIGH (ref 11.5–15.5)
WBC: 8.6 10*3/uL (ref 4.0–10.5)
nRBC: 0 % (ref 0.0–0.2)

## 2023-11-21 LAB — BASIC METABOLIC PANEL WITH GFR
Anion gap: 9 (ref 5–15)
BUN: 10 mg/dL (ref 6–20)
CO2: 21 mmol/L — ABNORMAL LOW (ref 22–32)
Calcium: 8.7 mg/dL — ABNORMAL LOW (ref 8.9–10.3)
Chloride: 106 mmol/L (ref 98–111)
Creatinine, Ser: 0.8 mg/dL (ref 0.44–1.00)
GFR, Estimated: >60 mL/min (ref >60)
Glucose, Bld: 91 mg/dL (ref 70–99)
Potassium: 3.9 mmol/L (ref 3.5–5.1)
Sodium: 136 mmol/L (ref 135–145)

## 2023-11-21 MED ORDER — DOCUSATE SODIUM 100 MG PO CAPS: 200.0000 mg | ORAL_CAPSULE | Freq: Two times a day (BID) | ORAL | 0 refills | Status: AC | PRN

## 2023-11-21 MED ORDER — FERROUS SULFATE 325 (65 FE) MG PO TABS
325.0000 mg | ORAL_TABLET | Freq: Two times a day (BID) | ORAL | 0 refills | Status: DC
  Filled 2023-11-21: qty 60, 30d supply, fill #0

## 2023-11-21 MED ORDER — APIXABAN (ELIQUIS) VTE STARTER PACK (10MG AND 5MG)
ORAL_TABLET | ORAL | 0 refills | Status: DC
  Filled 2023-11-21: qty 74, 30d supply, fill #0
  Filled 2023-11-21: qty 60, 23d supply, fill #0

## 2023-11-21 MED ORDER — CYANOCOBALAMIN 1000 MCG PO TABS
1000.0000 ug | ORAL_TABLET | Freq: Every day | ORAL | 0 refills | Status: AC
  Filled 2023-11-21: qty 30, 30d supply, fill #0

## 2023-11-21 MED ORDER — ACETAMINOPHEN 325 MG PO TABS
650.0000 mg | ORAL_TABLET | Freq: Four times a day (QID) | ORAL | 0 refills | Status: DC | PRN
  Filled 2023-11-21: qty 20, 3d supply, fill #0

## 2023-11-21 MED ORDER — FOLIC ACID 1 MG PO TABS
1.0000 mg | ORAL_TABLET | Freq: Every day | ORAL | 0 refills | Status: AC
  Filled 2023-11-21: qty 30, 30d supply, fill #0

## 2023-11-21 NOTE — Plan of Care (Signed)
 Progressing

## 2023-11-21 NOTE — Telephone Encounter (Signed)
 2-3 week f/u with MH or APP to establish care for submassive PE and likely chronic pulm HTN

## 2023-11-21 NOTE — Progress Notes (Signed)
   11/21/23 1000  Mobility  Activity Ambulated independently in hallway  Level of Assistance Independent  Assistive Device None  Distance Ambulated (ft) 300 ft  Activity Response Tolerated fair  Mobility Referral Yes  Mobility visit 1 Mobility  Mobility Specialist Start Time (ACUTE ONLY) 1000  Mobility Specialist Stop Time (ACUTE ONLY) 1010  Mobility Specialist Time Calculation (min) (ACUTE ONLY) 10 min   Mobility Specialist: Progress Note  Pt agreeable to mobility session - received in EOB. C/o SOB with exertion.  Returned to chair with all needs met - call bell within reach.   Nurse requested Mobility Specialist to perform oxygen saturation test with pt which includes removing pt from oxygen both at rest and while ambulating.  Below are the results from that testing.     Patient Saturations on Room Air at Rest = spO2 87%  Patient Saturations on 2 Liters of oxygen while Ambulating = sp02 89%  At end of testing pt left in room on 2  Liters of oxygen.  Reported results to nurse.    Tiffany English, BS Mobility Specialist Please contact via SecureChat or  Rehab office at [phone].

## 2023-11-21 NOTE — Discharge Summary (Addendum)
 Tiffany English UJW:[id number] DOB: 04-18-1986 DOA: 11/18/2023  PCP: Patient, No Pcp Per  Admit date: 11/18/2023  Discharge date: 11/21/2023  Admitted From: Home   Disposition:  Home   Recommendations for Outpatient Follow-up:   Follow up with PCP in 1-2 weeks  PCP Please obtain BMP/CBC, 2 view CXR in 1week,  (see Discharge instructions)   PCP Please follow up on the following pending results: Needs outpatient follow-up with pulmonary for sleep study, long-term anticoagulation plan.  Monitor anemia panel closely outpatient.   Home Health: None   Equipment/Devices: o2  Consultations: PCCM Discharge Condition: Stable    CODE STATUS: Full    Diet Recommendation: Heart Healthy     Chief Complaint  Patient presents with   Chest Pain     Brief history of present illness from the day of admission and additional interim summary    38 year old female history of anxiety and depression, GERD, on oral contraceptives, presented with progressive worsening shortness of breath. Initially felt had a viral upper respiratory symptoms. Due to progressive worsening shortness of breath patient presented to the ED, workup concerning for an acute bilateral PE on CT angiogram chest with RV/LV ratio of 1.49. ED PA consulted PCCM and felt patient did not need to be in the ICU and recommended hospitalist admission. Patient placed on IV heparin. 2D echo, lower extremity Dopplers ordered.                                                                  Hospital Course   #1 Acute bilateral PE acute right lower extremity DVT -Patient presented progressive worsening shortness of breath, CT angiogram chest done with acute bilateral PE with CT evidence of right heart strain consistent with at least submassive PE, also had evidence of acute right  lower extremity DVT.  Has underlying obesity and oral contraceptive pill use, requested to quit using oral contraception.  2D echo confirms RV strain and hypokinesis, extremity venous duplex positive for right lower extremity acute DVT.  Initially on heparin drip for 2 days thereafter Lovenox for a day and now stable on Eliquis, still mildly short of breath on exertion and ambulation will get 2 L nasal cannula oxygen likely temporarily, requested to follow-up with PCP, OB and pulmonary within a week.  She might benefit from outpatient sleep study as well.    2.  Depression/anxiety -Continue home Lexapro.   3.  Microcytic anemia with evidence of iron, folic acid and B12 deficiency -Likely iron deficiency anemia in menstruating female. -Patient with no overt bleeding. - She has been placed on iron and folic acid, also had low B12, replace.   4.  Hypokalemia - Replaced   5.  GERD -PPI.   6.  Obesity.  BMI at least 35.  Follow-up with PCP for  weight loss.    Discharge diagnosis     Principal Problem:   Acute pulmonary embolism (HCC) Active Problems:   Depression with anxiety   GERD   Microcytic anemia   Hypokalemia    Discharge instructions    Discharge Instructions     Diet - low sodium heart healthy   Complete by: As directed    Discharge instructions   Complete by: As directed    Follow with Primary MD or the recommended [facility] in 7 days, follow-up with your [facility] physician within a week and the recommended pulmonary physician in 1 to 2 weeks.  Get CBC, CMP, 2 view Chest X ray -  checked next visit with your primary MD   Activity: As tolerated with Full fall precautions use walker/cane & assistance as needed  Disposition Home    Diet: Heart Healthy    Special Instructions: If you have smoked or chewed Tobacco  in the last 2 yrs please stop smoking, stop any regular Alcohol  and or any Recreational drug use.  On your next visit with your primary  care physician please Get Medicines reviewed and adjusted.  Please request your Prim.MD to go over all Hospital Tests and Procedure/Radiological results at the follow up, please get all Hospital records sent to your Prim MD by signing hospital release before you go home.  If you experience worsening of your admission symptoms, develop shortness of breath, life threatening emergency, suicidal or homicidal thoughts you must seek medical attention immediately by calling 911 or calling your MD immediately  if symptoms less severe.  You Must read complete instructions/literature along with all the possible adverse reactions/side effects for all the Medicines you take and that have been prescribed to you. Take any new Medicines after you have completely understood and accpet all the possible adverse reactions/side effects.   Do not drive when taking Pain medications.  Do not take more than prescribed Pain, Sleep and Anxiety Medications  Wear Seat belts while driving.   For home use only DME oxygen   Complete by: As directed    Length of Need: 6 Months   Mode or (Route): Nasal cannula   Liters per Minute: 2   Frequency: Continuous (stationary and portable oxygen unit needed)   Oxygen conserving device: Yes   Oxygen delivery system: Gas   Increase activity slowly   Complete by: As directed        Discharge Medications   Allergies as of 11/21/2023   No Known Allergies      Medication List     STOP taking these medications    NORETHIN-ETH ESTRAD-FE BIPHAS PO       TAKE these medications    acetaminophen 325 MG tablet Commonly known as: TYLENOL Take 2 tablets (650 mg total) by mouth every 6 (six) hours as needed for mild pain (pain score 1-3) (or Fever >/= 101).   albuterol 108 (90 Base) MCG/ACT inhaler Commonly known as: VENTOLIN HFA 2 puffs every 4 (four) hours as needed for shortness of breath or wheezing.   apixaban 5 MG Tabs tablet Commonly known as: ELIQUIS Take 2  tablets (10 mg total) by mouth 2 (two) times daily for 7 days, THEN 1 tablet (5 mg total) 2 (two) times daily. Start taking on: Nov 21, 2023   buPROPion 100 MG tablet Commonly known as: WELLBUTRIN Take 100 mg by mouth daily. What changed: Another medication with the same name was removed. Continue taking this medication, and  follow the directions you see here.   cyanocobalamin 1000 MCG tablet Take 1 tablet (1,000 mcg total) by mouth daily. Start taking on: Nov 23, 2023   docusate sodium 100 MG capsule Commonly known as: Colace Take 2 capsules (200 mg total) by mouth 2 (two) times daily as needed for mild constipation.   escitalopram 20 MG tablet Commonly known as: LEXAPRO Take 20 mg by mouth daily.   ferrous sulfate 325 (65 FE) MG tablet Take 1 tablet (325 mg total) by mouth 2 (two) times daily with a meal.   folic acid 1 MG tablet Commonly known as: FOLVITE Take 1 tablet (1 mg total) by mouth daily. Start taking on: Nov 22, 2023   lansoprazole 30 MG capsule Commonly known as: PREVACID TAKE ONE CAPSULE BY MOUTH EVERY DAY               Durable Medical Equipment  (From admission, onward)           Start     Ordered   11/21/23 0000  For home use only DME oxygen       Question Answer Comment  Length of Need 6 Months   Mode or (Route) Nasal cannula   Liters per Minute 2   Frequency Continuous (stationary and portable oxygen unit needed)   Oxygen conserving device Yes   Oxygen delivery system Gas      11/21/23 0944             Follow-up Information     [facility]. Schedule an appointment as soon as possible for a visit in 1 week(s).   Why: Also follow-up with your OB physician within a week of discharge. Contact information: [address] [place]  [address] [phone]        [facility]. Schedule an appointment as soon as possible for a visit in  1 week(s).   Specialty: Pulmonology Why: Large PE, sleep study Contact information: [address] [place]  [address] [phone] Additional information: [address]  [address]  [place], [place] [address]                Major procedures and Radiology Reports - PLEASE review detailed and final reports thoroughly  -       VAS US  LOWER EXTREMITY VENOUS (DVT) Result Date: 11/20/2023  Lower Venous DVT Study Patient Name:  CREOLA MCLAMB  Date of Exam:   11/20/2023 Medical Rec #: [id number]       Accession #:    [id number] Date of Birth: [birth year]        Patient Gender: F Patient Age:   38 years Exam Location:  [facility] Procedure:      VAS US  LOWER EXTREMITY VENOUS (DVT) Referring Phys: TIMOTHY OPYD --------------------------------------------------------------------------------  Indications: Pulmonary embolism.  Risk Factors: Hormonal birth control. Comparison Study: No previous exams Performing Technologist: Jody Hill RVT, RDMS  Examination Guidelines: A complete evaluation includes B-mode imaging, spectral Doppler, color Doppler, and power Doppler as needed of all accessible portions of each vessel. Bilateral testing is considered an integral part of a complete examination. Limited examinations for reoccurring indications may be performed as noted. The reflux portion of the exam is performed with the patient in reverse Trendelenburg.    Summary: BILATERAL: -No evidence of popliteal cyst, bilaterally. RIGHT: - Findings consistent with acute deep vein thrombosis involving the right popliteal vein, right posterior tibial  veins, and right peroneal veins.   LEFT: - There is no evidence of deep vein thrombosis in the lower extremity.  *See table(s) above for measurements and observations.    Preliminary    ECHOCARDIOGRAM COMPLETE Result Date: 11/19/2023    ECHOCARDIOGRAM REPORT   Patient Name:   SHANIQA MCGEORGE Date of Exam: 11/19/2023 Medical Rec #:  [id number]       Height:       74.0 in Accession #:    [id number]     Weight:       208.6 lb Date of Birth:  01-04-1986       BSA:          2.213 m Patient Age:    37 years       BP:           139/87 mmHg Patient Gender: F              HR:           102 bpm. Exam Location:  Inpatient Procedure: 2D Echo, Cardiac Doppler and Color Doppler (Both Spectral and Color            Flow Doppler were utilized during procedure). Indications:    Pulmonary Embolus I26.09  History:        Patient has no prior history of Echocardiogram examinations.  Sonographer:    Terrilee Few RCS Referring Phys: [id number] TIMOTHY S OPYD IMPRESSIONS  1. Left ventricular ejection fraction, by estimation, is 60 to 65%. The left ventricle has normal function. The left ventricle has no regional wall motion abnormalities. There is mild left ventricular hypertrophy. Left ventricular diastolic parameters were normal. There is the interventricular septum is flattened in systole and diastole, consistent with right ventricular pressure and volume overload.  2. Poor RV visualization but on image 83, appears McConnell's sign with normal apical function but free wall hypokinesis, as can be seen in acute PE. Right ventricular systolic function is moderately reduced. The right ventricular size is mildly enlarged. There is mildly elevated pulmonary artery systolic pressure. The estimated right ventricular systolic pressure is 37.6 mmHg.  3. The mitral valve is normal in structure. Trivial mitral valve regurgitation.  4. The aortic valve was not well visualized. Aortic valve regurgitation is not visualized. No aortic stenosis is present.  5. Aortic dilatation noted. There is mild dilatation of the ascending aorta, measuring 39 mm.  6. The inferior vena cava is normal in size with greater than 50% respiratory variability, suggesting right atrial pressure of 3 mmHg. FINDINGS  Left Ventricle: Left ventricular ejection fraction, by estimation, is 60 to 65%. The left ventricle has  normal function. The left ventricle has no regional wall motion abnormalities. The left ventricular internal cavity size was normal in size. There is  mild left ventricular hypertrophy. The interventricular septum is flattened in systole and diastole, consistent with right ventricular pressure and volume overload. Left ventricular diastolic parameters were normal. Right Ventricle: The right ventricular size is mildly enlarged. Right vetricular wall thickness was not well visualized. Right ventricular systolic function is moderately reduced. There is mildly elevated pulmonary artery systolic pressure. The tricuspid  regurgitant velocity is 2.94 m/s, and with an assumed right atrial pressure of 3 mmHg, the estimated right ventricular systolic pressure is 37.6 mmHg. Left Atrium: Left atrial size was normal in size. Right Atrium: Right atrial size was normal in size. Pericardium: There is no evidence of pericardial effusion. Mitral Valve: The mitral valve is normal in structure. Trivial  mitral valve regurgitation. Tricuspid Valve: The tricuspid valve is normal in structure. Tricuspid valve regurgitation is trivial. Aortic Valve: The aortic valve was not well visualized. Aortic valve regurgitation is not visualized. No aortic stenosis is present. Aortic valve peak gradient measures 11.2 mmHg. Pulmonic Valve: The pulmonic valve was not well visualized. Pulmonic valve regurgitation is not visualized. Aorta: The aortic root is normal in size and structure and aortic dilatation noted. There is mild dilatation of the ascending aorta, measuring 39 mm. Venous: The inferior vena cava is normal in size with greater than 50% respiratory variability, suggesting right atrial pressure of 3 mmHg. IAS/Shunts: The interatrial septum was not well visualized.  LEFT VENTRICLE PLAX 2D LVIDd:         5.20 cm   Diastology LVIDs:         3.00 cm   LV e' medial:    11.20 cm/s LV PW:         1.20 cm   LV E/e' medial:  6.4 LV IVS:        1.10 cm    LV e' lateral:   21.10 cm/s LVOT diam:     2.30 cm   LV E/e' lateral: 3.4 LV SV:         68 LV SV Index:   31 LVOT Area:     4.15 cm  RIGHT VENTRICLE             IVC RV S prime:     10.80 cm/s  IVC diam: 1.30 cm TAPSE (M-mode): 2.4 cm LEFT ATRIUM             Index        RIGHT ATRIUM           Index LA diam:        3.30 cm 1.49 cm/m   RA Area:     10.90 cm LA Vol (A2C):   58.2 ml 26.31 ml/m  RA Volume:   21.60 ml  9.76 ml/m LA Vol (A4C):   44.6 ml 20.16 ml/m LA Biplane Vol: 53.3 ml 24.09 ml/m  AORTIC VALVE AV Area (Vmax): 2.64 cm AV Vmax:        167.00 cm/s AV Peak Grad:   11.2 mmHg LVOT Vmax:      106.00 cm/s LVOT Vmean:     69.200 cm/s LVOT VTI:       0.163 m  AORTA Ao Root diam: 3.70 cm Ao Asc diam:  3.90 cm MITRAL VALVE               TRICUSPID VALVE MV Area (PHT): 5.13 cm    TR Peak grad:   34.6 mmHg MV Decel Time: 148 msec    TR Vmax:        294.00 cm/s MV E velocity: 72.10 cm/s MV A velocity: 93.20 cm/s  SHUNTS MV E/A ratio:  0.77        Systemic VTI:  0.16 m                            Systemic Diam: 2.30 cm Carson Clara MD Electronically signed by Carson Clara MD Signature Date/Time: 11/19/2023/10:25:26 AM    Final    CT Angio Chest PE W and/or Wo Contrast Result Date: 11/18/2023 CLINICAL DATA:  Pulmonary embolism (PE) suspected, high prob Chest pain. EXAM: CT ANGIOGRAPHY CHEST WITH CONTRAST TECHNIQUE: Multidetector CT imaging of the chest was performed using the standard protocol during  bolus administration of intravenous contrast. Multiplanar CT image reconstructions and MIPs were obtained to evaluate the vascular anatomy. RADIATION DOSE REDUCTION: This exam was performed according to the departmental dose-optimization program which includes automated exposure control, adjustment of the mA and/or kV according to patient size and/or use of iterative reconstruction technique. CONTRAST:  75mL OMNIPAQUE IOHEXOL 350 MG/ML SOLN COMPARISON:  None Available. FINDINGS: Cardiovascular:  Acute bilateral pulmonary emboli. There are filling defects involving the segmental branches in the right upper lobe, right middle lobe, lingular, and both lower lobe pulmonary arteries. Thromboembolic burden is moderate. There is right heart strain. RV to LV ratio is 1.49. Main pulmonary artery is dilated 4.4 cm. Overall heart size is normal. Common origin of the brachiocephalic and left common carotid artery, variant arch anatomy. Mediastinum/Nodes: 16 mm anterior paratracheal node series 5, image 42. Additional shotty mediastinal lymph nodes. No enlarged hilar nodes. Unremarkable esophagus. Lungs/Pleura: Faint area of ground-glass in the right upper lobe series 6, image 52. No confluent consolidation. No pleural effusion. No features of pulmonary edema. 4 mm right lower lobe pulmonary nodule series 6, image 96. Upper Abdomen: Low-density lesions in the liver are too small to characterize but typically cysts. No acute upper abdominal findings. Musculoskeletal: Mild scoliosis and degenerative change in the spine. There are no acute or suspicious osseous abnormalities. Review of the MIP images confirms the above findings. IMPRESSION: 1. Positive for acute bilateral pulmonary emboli with CT evidence of right heart strain (RV/LV Ratio = 1.49) consistent with at least submassive (intermediate risk) PE. Moderate thromboembolic burden. The presence of right heart strain has been associated with an increased risk of morbidity and mortality. Please refer to the "Code PE Focused" order set in EPIC. 2. Dilated main pulmonary artery. 3. Faint area of ground-glass in the right upper lobe, nonspecific. This may represent an area of inflammation or developing pulmonary infarct. 4. A 4 mm right lower lobe pulmonary nodule.Per Fleischner Society Guidelines, no routine follow-up imaging is recommended. These guidelines do not apply to immunocompromised patients and patients with cancer. Follow up in patients with significant  comorbidities as clinically warranted. For lung cancer screening, adhere to Lung-RADS guidelines. Reference: Radiology. 2017; 284(1):228-43. Critical Value/emergent results were called by telephone at the time of interpretation on 11/18/2023 at 10:24 pm to provider Cherre Cornish, who verbally acknowledged these results. Electronically Signed   By: Chadwick Colonel M.D.   On: 11/18/2023 22:27    Micro Results     No results found for this or any previous visit (from the past 240 hours).  Today   Subjective    Shameaka Glendinning today has no headache,no chest abdominal pain,no new weakness tingling or numbness, feels much better wants to go home today.     Objective   Blood pressure 134/85, pulse 98, temperature 98 F (36.7 C), temperature source Oral, resp. rate (!) 22, height 6\' 2"  (1.88 m), weight 94.6 kg, last menstrual period 11/04/2023, SpO2 95%.  No intake or output data in the 24 hours ending 11/21/23 1013  Exam  Awake Alert, No new F.N deficits,    [place].AT,PERRAL Supple Neck,   Symmetrical Chest wall movement, Good air movement bilaterally, CTAB RRR,No Gallops,   +ve B.Sounds, Abd Soft, Non tender,  No Cyanosis, Clubbing or edema    Data Review   Recent Labs  Lab 11/18/23 2029 11/18/23 2300 11/19/23 0521 11/20/23 0638 11/21/23 0512  WBC 11.9* 11.7* 10.2 10.1 8.6  HGB 10.1* 9.8* 9.5* 9.8* 9.0*  HCT 34.6* 33.3*  32.1* 33.8* 31.0*  PLT 339 320 300 348 317  MCV 70.6* 70.4* 71.2* 72.7* 70.5*  MCH 20.6* 20.7* 21.1* 21.1* 20.5*  MCHC 29.2* 29.4* 29.6* 29.0* 29.0*  RDW 17.5* 17.2* 17.4* 17.6* 17.2*    Recent Labs  Lab 11/18/23 2029 11/19/23 0521 11/19/23 2147 11/20/23 0638 11/21/23 0512  NA 136 136  --  139 136  K 3.5 3.3*  --  3.9 3.9  CL 104 106  --  107 106  CO2 18* 17*  --  19* 21*  ANIONGAP 14 13  --  13 9  GLUCOSE 146* 105*  --  113* 91  BUN 10 7  --  11 10  CREATININE 0.98 0.78  --  0.89 0.80  LATICACIDVEN  --   --  1.3  --   --   BNP  --   --  327.5*  --    --   MG  --  2.2  --   --   --   CALCIUM 9.1 8.6*  --  9.0 8.7*    Total Time in preparing paper work, data evaluation and todays exam - 35 minutes  Signature  -    Lynnwood Sauer M.D on 11/21/2023 at 10:13 AM   -  To page go to [email or web address]

## 2023-11-21 NOTE — Telephone Encounter (Signed)
 Patient Product/process development scientist completed.    The patient is insured through [employer] and [employer].     Ran test claim for Eliquis Starter Pack and the current 30 day co-pay is $4.00.   This test claim was processed through [facility]- copay amounts may vary at other pharmacies due to pharmacy/plan contracts, or as the patient moves through the different stages of their insurance plan.     Morgan Arab, CPHT Pharmacy Technician III Certified Patient Advocate [facility] Patient Advocate Team Direct Number: [phone]  Fax: [phone]

## 2023-11-21 NOTE — Plan of Care (Signed)
                                      [facility]                            [address] [place], [place] [address]      Tiffany English was admitted to the Hospital on 11/18/2023 and Discharged  11/21/2023 and should be excused from work/school   for 10  days starting from date -  11/18/2023 , may return to work/school without any restrictions.  Call Lynnwood Sauer MD, [facility]  [phone] with questions.  Lynnwood Sauer M.D on 11/21/2023,at 9:39 AM  [facility]   Office  [phone]

## 2023-11-21 NOTE — Progress Notes (Signed)
 11/21/2023     I have seen and evaluated the patient for pulmonary embolism.   S:  Feeling better this am, able to ambulate, to go home soon with O2.   O: Blood pressure 96/66, pulse 64, temperature 97.6 F (36.4 C), temperature source Oral, resp. rate 17, height 5\' 3"  (1.6 m), weight 81.6 kg, SpO2 94 %.    No distress Lungs clear Moves to command No edema Aox3 Heart sounds regular  Labs look okay   A:  Acute hypoxemic respiratory failure secondary to submassive PE in setting of OCP use Baseline dilated PA raises question of more chronic issue (?OSA, ?CTEPH) Ovarian cyst  P:  For home today with eliquis Will set up appt in office in 2-3 weeks to wean O2 and workup other causes of pulmonary HTN AC for 3 months at minimum, likely needs repeat echo at t+57mo To stop OCP and f/u with GYN   Ardelle Kos MD [facility] Prefer epic messenger for cross cover needs If after hours, please call E-link

## 2023-11-23 ENCOUNTER — Telehealth: Payer: Self-pay | Admitting: *Deleted

## 2023-11-23 NOTE — Telephone Encounter (Signed)
 Copied from CRM [id number]. Topic: Medical Record Request - Other >> Nov 22, 2023  2:11 PM Tiffany English wrote: Reason for CRM: Patient [phone] was advised to contact Dr. Felton Hough, if she's having any issue to have return to work letter. Patient states there's an conflict, since she's on oxygen still and needs a letter from Dr. Felipe Horton. Patient does not know where to call to contact Dr. Felipe Horton, and is asking if the office can help on this matter, if not please call back just to let her know. Please advise.  Called and spoke with the pt  She states that she is unable to physically return to work due to supplemental o2 needs  She is scheduled for visit with APP 12/26/23 and will re access her then, but needs work excuse in the meantime  She is able to work from home if this is okay, just needs to be documented in a letter  She would like for letter to be uploaded to her mychart  She states Dr Felipe Horton had mentioned he could help her with this  Please advise, thanks!

## 2023-11-28 ENCOUNTER — Encounter: Payer: Self-pay | Admitting: Family Medicine

## 2023-11-28 ENCOUNTER — Ambulatory Visit: Payer: Self-pay | Admitting: Family Medicine

## 2023-11-28 ENCOUNTER — Ambulatory Visit: Admitting: Family Medicine

## 2023-11-28 ENCOUNTER — Ambulatory Visit

## 2023-11-28 VITALS — BP 137/89 | HR 95 | Temp 97.7°F | Ht 74.0 in | Wt 308.4 lb

## 2023-11-28 DIAGNOSIS — I2699 Other pulmonary embolism without acute cor pulmonale: Secondary | ICD-10-CM | POA: Diagnosis not present

## 2023-11-28 DIAGNOSIS — Z09 Encounter for follow-up examination after completed treatment for conditions other than malignant neoplasm: Secondary | ICD-10-CM | POA: Insufficient documentation

## 2023-11-28 DIAGNOSIS — Z7689 Persons encountering health services in other specified circumstances: Secondary | ICD-10-CM | POA: Diagnosis not present

## 2023-11-28 MED ORDER — APIXABAN 5 MG PO TABS: 5.0000 mg | ORAL_TABLET | Freq: Two times a day (BID) | ORAL | 0 refills | Status: DC

## 2023-11-28 NOTE — Patient Instructions (Signed)
 [facility]  [address] [place] [address]  The radiology department is on the first floor which is best accessed by going around to the back of the building. No appointment necessary. You can go at your convenience.

## 2023-11-28 NOTE — Assessment & Plan Note (Addendum)
 Admitted to hospital from 5/9-5/12 for massive pulmonary embolism. Provoked from oral contraceptive use. She has stopped taking OCPs. Has appointment with GYN to discuss birth control.  Started on Eliquis  starter pack. Currently taking Eliquis  5 mg BID. Wearing oxygen 2 L at home, does not have it on at this visit. Oxygen saturation 95% in office. No shortness of breath observed today. Has follow-up appointment on 6/16 with pulmonary.  Refill sent today for Eliquis .  BMP, CBC, and chest x-ray per discharging physician.

## 2023-11-28 NOTE — Progress Notes (Signed)
 New Patient Office Visit  Subjective    Patient ID: Tiffany English, female    DOB: 1986-07-08  Age: 38 y.o. MRN: [id number]  CC:  Chief Complaint  Patient presents with   New Patient (Initial Visit)    Est. Care pt also f./u on ER visit pt states she was hospitalized with 5 PE from her [facility]      HPI Tiffany English presents to establish care with this practice. She is new to me. She was on oral contraceptives since November. Has stopped taking OCP now. Has appointment with GYN to discuss birth control. Prior to diagnosis, she felt like she had a virus. Went to urgent care with SOB, sent to hospital.  Still on oxygen at home due to low oxygen saturation.  She was started on Eliquis  starter pack while in the hospital. Currently taking Eliqus 5 mg BID. Need refill today. Shortness of breath is much better now.  Has follow-up appointment with pulmonary on 6/16.    Admitted 5/9-5/12 for PE Needs BMP, CBC, chest x-ray F/u with pulmonary for sleep study, long-term anticoagulation. Appointment on 12/26/23 with pulm Order Eliquis  5 mg BID for maintenance Monitor anemia as outpatient Pulmonary appointment 6/16    Outpatient Encounter Medications as of 11/28/2023  Medication Sig   acetaminophen  (TYLENOL ) 325 MG tablet Take 2 tablets (650 mg total) by mouth every 6 (six) hours as needed for mild pain (pain score 1-3) (or Fever >/= 101).   albuterol (VENTOLIN HFA) 108 (90 Base) MCG/ACT inhaler 2 puffs every 4 (four) hours as needed for shortness of breath or wheezing.   apixaban  (ELIQUIS ) 5 MG TABS tablet Take 1 tablet (5 mg total) by mouth 2 (two) times daily.   buPROPion  (WELLBUTRIN ) 100 MG tablet Take 100 mg by mouth daily.   cyanocobalamin  1000 MCG tablet Take 1 tablet (1,000 mcg total) by mouth daily.   docusate sodium  (COLACE) 100 MG capsule Take 2 capsules (200 mg total) by mouth 2 (two) times daily as needed for mild constipation.   escitalopram  (LEXAPRO ) 20 MG tablet Take 20 mg by mouth  daily.   ferrous sulfate  325 (65 FE) MG tablet Take 1 tablet (325 mg total) by mouth 2 (two) times daily with a meal.   folic acid  (FOLVITE ) 1 MG tablet Take 1 tablet (1 mg total) by mouth daily.   lansoprazole (PREVACID) 30 MG capsule TAKE ONE CAPSULE BY MOUTH EVERY DAY   [DISCONTINUED] APIXABAN  (ELIQUIS ) VTE STARTER PACK (10MG  AND 5MG ) Take 2 tablets (10 mg) by mouth 2 (two) times daily for 7 days, THEN 1 tablet (5 mg) 2 (two) times daily   No facility-administered encounter medications on file as of 11/28/2023.    Past Medical History:  Diagnosis Date   Anemia    Anxiety    Depression    Gallbladder polyp    GERD (gastroesophageal reflux disease)    IBS (irritable bowel syndrome)    Obesity     History reviewed. No pertinent surgical history.  Family History  Problem Relation Age of Onset   Colon cancer Other        uncle   Colon cancer Other        grandfather   Prostate cancer Other        uncle   Liver disease Other        great grandmother   Anxiety disorder Mother    Asthma Mother    COPD Mother    Depression Mother  Obesity Mother    Obesity Father    Diabetes Maternal Grandmother    Diabetes Paternal Grandmother    Varicose Veins Paternal Grandmother    Cancer Maternal Uncle    Cancer Maternal Aunt     Social History   Socioeconomic History   Marital status: Single    Spouse name: Not on file   Number of children: Not on file   Years of education: Not on file   Highest education level: Bachelor's degree (e.g., BA, AB, BS)  Occupational History   Not on file  Tobacco Use   Smoking status: Former    Current packs/day: 0.00    Average packs/day: 0.3 packs/day for 2.0 years (0.5 ttl pk-yrs)    Types: E-cigarettes, Cigarettes    Quit date: 02/10/2019    Years since quitting: 4.8   Smokeless tobacco: Not on file  Substance and Sexual Activity   Alcohol use: Not Currently   Drug use: No   Sexual activity: Not Currently    Birth control/protection:  Other-see comments    Comment: Stopped due to PE, finding replacement  Other Topics Concern   Not on file  Social History Narrative   Not on file   Social Drivers of Health   Financial Resource Strain: Low Risk  (11/24/2023)   Overall Financial Resource Strain (CARDIA)    Difficulty of Paying Living Expenses: Not very hard  Food Insecurity: No Food Insecurity (11/24/2023)   Hunger Vital Sign    Worried About Running Out of Food in the Last Year: Never true    Ran Out of Food in the Last Year: Never true  Transportation Needs: No Transportation Needs (11/24/2023)   PRAPARE - Administrator, Civil Service (Medical): No    Lack of Transportation (Non-Medical): No  Physical Activity: Insufficiently Active (11/24/2023)   Exercise Vital Sign    Days of Exercise per Week: 2 days    Minutes of Exercise per Session: 30 min  Stress: Stress Concern Present (11/24/2023)   [employer] of Occupational Health - Occupational Stress Questionnaire    Feeling of Stress : Very much  Social Connections: Unknown (11/24/2023)   Social Connection and Isolation Panel [NHANES]    Frequency of Communication with Friends and Family: More than three times a week    Frequency of Social Gatherings with Friends and Family: Patient declined    Attends Religious Services: Patient declined    Database administrator or Organizations: No    Attends Engineer, structural: Not on file    Marital Status: Divorced  Intimate Partner Violence: Not At Risk (11/19/2023)   Humiliation, Afraid, Rape, and Kick questionnaire    Fear of Current or Ex-Partner: No    Emotionally Abused: No    Physically Abused: No    Sexually Abused: No    Review of Systems  Respiratory:  Negative for shortness of breath.   Cardiovascular:  Negative for chest pain.        Objective    BP 137/89 (BP Location: Left Arm, Patient Position: Sitting, Cuff Size: Large)   Pulse 95   Temp 97.7 F (36.5 C) (Oral)   Ht  6\' 2"  (1.88 m)   Wt (!) 308 lb 6 oz (139.9 kg)   LMP 11/04/2023   SpO2 95%   BMI 39.59 kg/m   Physical Exam Vitals and nursing note reviewed.  Constitutional:      Appearance: Normal appearance. She is obese.  Cardiovascular:  Rate and Rhythm: Regular rhythm.     Heart sounds: Normal heart sounds.  Pulmonary:     Effort: Pulmonary effort is normal.     Breath sounds: Normal breath sounds.  Skin:    General: Skin is warm and dry.  Neurological:     General: No focal deficit present.     Mental Status: She is alert. Mental status is at baseline.  Psychiatric:        Mood and Affect: Mood normal.        Behavior: Behavior normal.        Thought Content: Thought content normal.        Judgment: Judgment normal.        Assessment & Plan:   Problem List Items Addressed This Visit     Acute pulmonary embolism (HCC)   Admitted to hospital from 5/9-5/12 for massive pulmonary embolism. Provoked from oral contraceptive use. She has stopped taking OCPs. Has appointment with GYN to discuss birth control.  Started on Eliquis  starter pack. Currently taking Eliquis  5 mg BID. Wearing oxygen 2 L at home, does not have it on at this visit. Oxygen saturation 95% in office. No shortness of breath observed today. Has follow-up appointment on 6/16 with pulmonary.  Refill sent today for Eliquis .  BMP, CBC, and chest x-ray per discharging physician.        Relevant Medications   apixaban  (ELIQUIS ) 5 MG TABS tablet   Other Relevant Orders   Basic metabolic panel with GFR   CBC   DG Chest 2 View   Establishing care with new doctor, encounter for [facility] discharge follow-up   Relevant Orders   Basic metabolic panel with GFR   CBC   DG Chest 2 View  Agrees with plan of care discussed.  Questions answered.   Return if symptoms worsen or fail to improve.   Mickiel Albany, FNP

## 2023-11-29 LAB — CBC
Hematocrit: 38.6 % (ref 34.0–46.6)
Hemoglobin: 11 g/dL — ABNORMAL LOW (ref 11.1–15.9)
MCH: 21.1 pg — ABNORMAL LOW (ref 26.6–33.0)
MCHC: 28.5 g/dL — ABNORMAL LOW (ref 31.5–35.7)
MCV: 74 fL — ABNORMAL LOW (ref 79–97)
Platelets: 585 10*3/uL — ABNORMAL HIGH (ref 150–450)
RBC: 5.22 x10E6/uL (ref 3.77–5.28)
RDW: 16.3 % — ABNORMAL HIGH (ref 11.7–15.4)
WBC: 8.9 10*3/uL (ref 3.4–10.8)

## 2023-11-29 LAB — BASIC METABOLIC PANEL WITH GFR
BUN/Creatinine Ratio: 10 (ref 9–23)
BUN: 9 mg/dL (ref 6–20)
CO2: 20 mmol/L (ref 20–29)
Calcium: 9.7 mg/dL (ref 8.7–10.2)
Chloride: 100 mmol/L (ref 96–106)
Creatinine, Ser: 0.91 mg/dL (ref 0.57–1.00)
Glucose: 92 mg/dL (ref 70–99)
Potassium: 4.8 mmol/L (ref 3.5–5.2)
Sodium: 136 mmol/L (ref 134–144)
eGFR: 83 mL/min/{1.73_m2} (ref >59)

## 2023-11-30 ENCOUNTER — Telehealth: Payer: Self-pay

## 2023-11-30 NOTE — Telephone Encounter (Signed)
 Adv PT this is soonest appt. Put on wait list. Added to appt notes will want to address sleep and pulm.

## 2023-11-30 NOTE — Telephone Encounter (Signed)
 Copied from CRM [id number]. Topic: Appointments - Scheduling Inquiry for Clinic >> Nov 22, 2023  1:37 PM Hilton Lucky wrote: Reason for CRM: Patient is calling in to schedule an appointment following a hospital visit for a submassive PE. Patient states she was advised to be seen within the next week if at all possible to establish care for both submassive PE care and a sleep study.   Informed patient she currently has an appointment scheduled with APP Eulas Hick for 06/16. Patient is inquiring about a sooner date if possible and if both her sleep and pulmonary concerns can be addressed at this appointment. Please reach out to patient to advise on sooner appointment.  Agent did not advise regarding if all concerns will be addressed. E2C2 resources currently state patients for pulmonary care must establish with an MD/DO, where they may establish for sleep with APPs. As this patient appears to be both pulmonary and sleep, would be able to advise for providers we have marked as Sleep + Pulmonary, but refrained from advising due to conflicting information. Please address this inquiry with patient. >> Nov 23, 2023  1:51 PM Rainell Buoy wrote: Patient was seen in the hospital by our rounding team. Drena Gentile NP address both issues for the appointment?    Pt is scheduled to see Irby Mannan, NP on 12-26-23. Because it is going to be a hospital follow up as well, the pt will need to see a MD for this as pt has never been seen by our office before. Pt can still see Beth by a different appointment for sleep unless the MD also see's pt's for sleep. Routing to front desk for scheduling.

## 2023-12-02 ENCOUNTER — Other Ambulatory Visit: Payer: Self-pay | Admitting: Family Medicine

## 2023-12-02 DIAGNOSIS — I2699 Other pulmonary embolism without acute cor pulmonale: Secondary | ICD-10-CM

## 2023-12-26 ENCOUNTER — Inpatient Hospital Stay: Admitting: Primary Care

## 2024-01-04 ENCOUNTER — Encounter: Payer: Self-pay | Admitting: Nurse Practitioner

## 2024-01-04 ENCOUNTER — Inpatient Hospital Stay

## 2024-01-04 ENCOUNTER — Inpatient Hospital Stay: Attending: Nurse Practitioner | Admitting: Nurse Practitioner

## 2024-01-04 ENCOUNTER — Telehealth: Payer: Self-pay

## 2024-01-04 VITALS — BP 121/86 | HR 86 | Temp 97.7°F | Resp 18 | Ht 74.0 in | Wt 309.9 lb

## 2024-01-04 DIAGNOSIS — N920 Excessive and frequent menstruation with regular cycle: Secondary | ICD-10-CM | POA: Insufficient documentation

## 2024-01-04 DIAGNOSIS — I2609 Other pulmonary embolism with acute cor pulmonale: Secondary | ICD-10-CM | POA: Diagnosis not present

## 2024-01-04 DIAGNOSIS — D500 Iron deficiency anemia secondary to blood loss (chronic): Secondary | ICD-10-CM | POA: Diagnosis present

## 2024-01-04 DIAGNOSIS — Z79899 Other long term (current) drug therapy: Secondary | ICD-10-CM | POA: Diagnosis not present

## 2024-01-04 DIAGNOSIS — E538 Deficiency of other specified B group vitamins: Secondary | ICD-10-CM | POA: Insufficient documentation

## 2024-01-04 DIAGNOSIS — Z7901 Long term (current) use of anticoagulants: Secondary | ICD-10-CM | POA: Insufficient documentation

## 2024-01-04 DIAGNOSIS — Z87891 Personal history of nicotine dependence: Secondary | ICD-10-CM | POA: Diagnosis not present

## 2024-01-04 LAB — CBC WITH DIFFERENTIAL (CANCER CENTER ONLY)
Abs Immature Granulocytes: 0.01 10*3/uL (ref 0.00–0.07)
Basophils Absolute: 0 10*3/uL (ref 0.0–0.1)
Basophils Relative: 1 %
Eosinophils Absolute: 0.1 10*3/uL (ref 0.0–0.5)
Eosinophils Relative: 2 %
HCT: 40.2 % (ref 36.0–46.0)
Hemoglobin: 12.5 g/dL (ref 12.0–15.0)
Immature Granulocytes: 0 %
Lymphocytes Relative: 35 %
Lymphs Abs: 2.2 10*3/uL (ref 0.7–4.0)
MCH: 23.9 pg — ABNORMAL LOW (ref 26.0–34.0)
MCHC: 31.1 g/dL (ref 30.0–36.0)
MCV: 76.9 fL — ABNORMAL LOW (ref 80.0–100.0)
Monocytes Absolute: 0.4 10*3/uL (ref 0.1–1.0)
Monocytes Relative: 7 %
Neutro Abs: 3.6 10*3/uL (ref 1.7–7.7)
Neutrophils Relative %: 55 %
Platelet Count: 333 10*3/uL (ref 150–400)
RBC: 5.23 MIL/uL — ABNORMAL HIGH (ref 3.87–5.11)
RDW: 23.7 % — ABNORMAL HIGH (ref 11.5–15.5)
WBC Count: 6.4 10*3/uL (ref 4.0–10.5)
nRBC: 0 % (ref 0.0–0.2)

## 2024-01-04 LAB — FERRITIN: Ferritin: 21 ng/mL (ref 11–307)

## 2024-01-04 NOTE — Telephone Encounter (Signed)
 Responded back via phone to patients mychart message that was sent over. Made aware that per Lacie NP continue taking Iron PO BID until follow-up visit. It can take up to 3 months to take full effect, meat/protein will be up to her. Voiced understanding, no further questions.

## 2024-01-04 NOTE — Progress Notes (Addendum)
 [facility]   Telephone:(336) [phone] Fax:([phone]) [phone]   Clinic New consult Note   Patient Care Team: English Tiffany SAUNDERS, FNP as PCP - General (Family Medicine) 01/04/2024  CHIEF COMPLAINTS/PURPOSE OF CONSULTATION:  Pulmonary embolism, referred by PCP Tiffany Booker, Tiffany English  HISTORY OF PRESENTING ILLNESS:  Tiffany English 38 y.o. female with PMH including anxiety, depression, GERD, IBS, oral contraceptives, and iron deficiency from menorrhagia is here because of acute pulmonary embolism.  Presented to the ED 11/18/2023 with progressive shortness of breath, vitals showed O2 sat 92% with tachycardia up to 127 and elevated BP.  CTA positive for acute bilateral PE with evidence of right heart strain consistent with at least submassive PE with moderate thromboembolic burden.  Doppler confirmed acute DVT in the right popliteal, posterior tibial, and peroneal veins.  She was admitted for heparin  and Lovenox  bridge to Eliquis .  She required supplemental oxygen briefly and an outpatient sleep study was recommended. No other provoking factors such as infection, surgery, trauma, pregnancy, or being sedentary.   Additionally, she has had a chronic microcytic anemia since at least 2008 Hgb 10.6, MCV 71.6, iron 25, 5% saturation, and a ferritin of 2.6.  Has always had heavy periods and was also a vegetarian at this time. In 05/2023, she developed persistent menstrual bleeding daily x4 months and was placed on oral contraceptive to control menorrhagia and was helping. At the time of her admission for PE Hgb 10.1 which dropped to 9, MCV 70.6, normal platelet count.  Further workup showed B12 122, iron 21, TIBC 518, 4% saturation, and a ferritin of 8.  She was given B12 injection x 2 while hospitalized; discharge hemoglobin improved to 11.0, hematocrit normalized, MCV improved to 74, platelets slightly elevated to 585.  She was discharged home on oral B12, iron, and folic acid .   Socially she is divorced, no  children. 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 Had intentionally lost 150 lbs in her younger hears through diet and exercise; since mother moved in she has gained some back. No prior h/o bariatric surgery. Drank alcohol for some time, 4-5 times per week up to 3-4 glasses but has stopped. No alcohol in ~2 months. Denies tobacco or drug use.   Today she presents by herself. Breathing has normalized. Tolerating Eliquis . Since being off [facility] she just started menstrual bleeding and is not heavy yet. Denies other bleeding. Tolerating iron/B12/folate, having some constipation but usually prone to diarrhea (IBS-D) so this is welcome.    MEDICAL HISTORY:  Past Medical History:  Diagnosis Date   Anemia    Anxiety    Depression    Gallbladder polyp    GERD (gastroesophageal reflux disease)    IBS (irritable bowel syndrome)    Obesity     SURGICAL HISTORY: History reviewed. No pertinent surgical history.  SOCIAL HISTORY: Social History   Socioeconomic History   Marital status: Single    Spouse name: Not on file   Number of children: Not on file   Years of education: Not on file   Highest education level: Bachelor's degree (e.g., BA, AB, BS)  Occupational History   Occupation: HR  Tobacco Use   Smoking status: Former    Current packs/day: 0.00    Average packs/day: 0.3 packs/day for 2.0 years (0.5 ttl pk-yrs)    Types: E-cigarettes, Cigarettes    Quit date: 02/10/2019    Years since quitting: 4.9   Smokeless tobacco: Not on file  Substance  and Sexual Activity   Alcohol use: Not Currently   Drug use: No   Sexual activity: Not Currently    Birth control/protection: Other-see comments    Comment: Stopped due to PE, finding replacement  Other Topics Concern   Not on file  Social History Narrative   Not on file   Social Drivers of Health   Financial Resource Strain: Low Risk  (11/24/2023)   Overall Financial Resource Strain (CARDIA)    Difficulty of Paying  Living Expenses: Not very hard  Food Insecurity: No Food Insecurity (01/04/2024)   Hunger Vital Sign    Worried About Running Out of Food in the Last Year: Never true    Ran Out of Food in the Last Year: Never true  Transportation Needs: Unknown (01/04/2024)   PRAPARE - Transportation    Lack of Transportation (Medical): No    Lack of Transportation (Non-Medical): Not on file  Physical Activity: Insufficiently Active (11/24/2023)   Exercise Vital Sign    Days of Exercise per Week: 2 days    Minutes of Exercise per Session: 30 min  Stress: Stress Concern Present (11/24/2023)   [employer] of Occupational Health - Occupational Stress Questionnaire    Feeling of Stress : Very much  Social Connections: Unknown (11/24/2023)   Social Connection and Isolation Panel    Frequency of Communication with Friends and Family: More than three times a week    Frequency of Social Gatherings with Friends and Family: Patient declined    Attends Religious Services: Patient declined    Database administrator or Organizations: No    Attends Engineer, structural: Not on file    Marital Status: Divorced  Intimate Partner Violence: Not At Risk (01/04/2024)   Humiliation, Afraid, Rape, and Kick questionnaire    Fear of Current or Ex-Partner: No    Emotionally Abused: No    Physically Abused: No    Sexually Abused: No    FAMILY HISTORY: Family History  Problem Relation Age of Onset   Colon cancer Other        uncle   Colon cancer Other        grandfather   Prostate cancer Other        uncle   Liver disease Other        great grandmother   Anxiety disorder Mother    Asthma Mother    COPD Mother    Depression Mother    Obesity Mother    Obesity Father    Diabetes Maternal Grandmother    Diabetes Paternal Grandmother    Varicose Veins Paternal Grandmother    Cancer Maternal Uncle    Cancer Maternal Aunt     ALLERGIES:  has no known allergies.  MEDICATIONS:  Current Outpatient  Medications  Medication Sig Dispense Refill   acetaminophen  (TYLENOL ) 325 MG tablet Take 2 tablets (650 mg total) by mouth every 6 (six) hours as needed for mild pain (pain score 1-3) (or Fever >/= 101). 20 tablet 0   apixaban  (ELIQUIS ) 5 MG TABS tablet Take 1 tablet (5 mg total) by mouth 2 (two) times daily. 180 tablet 0   buPROPion  (WELLBUTRIN ) 100 MG tablet Take 100 mg by mouth daily.     cyanocobalamin  1000 MCG tablet Take 1 tablet (1,000 mcg total) by mouth daily. 30 tablet 0   escitalopram  (LEXAPRO ) 20 MG tablet Take 20 mg by mouth daily.     ferrous sulfate  325 (65 FE) MG tablet Take 1 tablet (325  mg total) by mouth 2 (two) times daily with a meal. 60 tablet 0   folic acid  (FOLVITE ) 1 MG tablet Take 1 tablet (1 mg total) by mouth daily. 30 tablet 0   lansoprazole (PREVACID) 30 MG capsule TAKE ONE CAPSULE BY MOUTH EVERY DAY 30 capsule 0   albuterol (VENTOLIN HFA) 108 (90 Base) MCG/ACT inhaler 2 puffs every 4 (four) hours as needed for shortness of breath or wheezing. (Patient not taking: Reported on 01/04/2024)     No current facility-administered medications for this visit.    REVIEW OF SYSTEMS:   Constitutional: Denies fevers, chills or abnormal night sweats Eyes: Denies blurriness of vision, double vision or watery eyes Ears, nose, mouth, throat, and face: Denies mucositis or sore throat Respiratory: Denies cough, dyspnea or wheezes Cardiovascular: Denies palpitation, chest discomfort or lower extremity swelling Gastrointestinal:  Denies nausea, heartburn or change in bowel habits (+) IBS-D GU/GYN: (+) menorrhagia  Skin: Denies abnormal skin rashes Lymphatics: Denies new lymphadenopathy or easy bruising Neurological:Denies numbness, tingling or new weaknesses Behavioral/Psych: (+) anxiety (+) depression Mood is stable, no new changes  All other systems were reviewed with the patient and are negative.  PHYSICAL EXAMINATION: ECOG PERFORMANCE STATUS: 0 - Asymptomatic  Vitals:    01/04/24 0830  BP: 121/86  Pulse: 86  Resp: 18  Temp: 97.7 F (36.5 C)  SpO2: 100%   Filed Weights   01/04/24 0830  Weight: (!) 309 lb 14.4 oz (140.6 kg)    GENERAL:alert, no distress and comfortable SKIN: no ecchymoses, rashes or significant lesions EYES: sclera clear NECK: without mass LYMPH:  no palpable cervical, supraclavicular, axillary, or inguinal  LUNGS: clear with normal breathing effort HEART: regular rate & rhythm, no lower extremity edema ABDOMEN:abdomen soft, non-tender and normal bowel sounds.  No splenomegaly Musculoskeletal:no cyanosis of digits and no clubbing  PSYCH: alert & oriented x 3 with fluent speech NEURO: no focal motor/sensory deficits  LABORATORY DATA:  I have reviewed the data as listed    Latest Ref Rng & Units 01/04/2024    9:45 AM 11/28/2023    9:52 AM 11/21/2023    5:12 AM  CBC  WBC 4.0 - 10.5 K/uL 6.4  8.9  8.6   Hemoglobin 12.0 - 15.0 g/dL 87.4  88.9  9.0   Hematocrit 36.0 - 46.0 % 40.2  38.6  31.0   Platelets 150 - 400 K/uL 333  585  317        Latest Ref Rng & Units 11/28/2023    9:52 AM 11/21/2023    5:12 AM 11/20/2023    6:38 AM  CMP  Glucose 70 - 99 mg/dL 92  91  886   BUN 6 - 20 mg/dL 9  10  11    Creatinine 0.57 - 1.00 mg/dL 9.08  9.19  9.10   Sodium 134 - 144 mmol/L 136  136  139   Potassium 3.5 - 5.2 mmol/L 4.8  3.9  3.9   Chloride 96 - 106 mmol/L 100  106  107   CO2 20 - 29 mmol/L 20  21  19    Calcium 8.7 - 10.2 mg/dL 9.7  8.7  9.0      RADIOGRAPHIC STUDIES: I have personally reviewed the radiological images as listed and agreed with the findings in the report. No results found.  ASSESSMENT & PLAN: 38 year old pre/peri-menopausal female  Acute right lower extremity DVT/PE  -Presented to the ED 11/18/2023 with progressive dyspnea, hypoxia, tachycardia - CTA positive for acute bilateral  PE, evidence of right heart strain c/w at least submassive PE; moderate thromboembolic burden.  -No evidence of malignancy    -Doppler: acute DVT in the right popliteal, posterior tibial, and peroneal veins.   -11/18/23 - 11/21/23 admitted for heparin  and Lovenox  bridge to Eliquis . Required supplemental oxygen briefly and an outpatient sleep study was recommended.    Anemia, secondary to iron deficiency from menstrual blood loss and B12 deficiency -Chronic microcytic anemia since at least 2008 Hgb 10.6, MCV 71.6, iron 25, 5% saturation, and a ferritin of 2.6.  Had heavy menstrual periods and was vegetarian at the time, on oral iron  -05/2023: persistent menstrual bleeding daily x4 months, started oral contraceptive -menorrhagia improved.  - 11/18/2023  Hgb 10.1 which dropped to 9, MCV 70.6, normal platelet count. - B12 122 (no h/o bariatric surgery; does have IBS-D, prior alcohol use) - received B12 inj x2 and on oral B12 - Iron 21, TIBC 518, 4% saturation, and a ferritin of 8 -Discharged home on oral B12, iron, and folic acid  since 11/21/23   IBS-the, GERD -On PPI per GI, separates from oral iron  Anxiety, depression, obesity -Wellbutrin , lexapro  per PCP  5.  16mm paratracheal node on chest CT 11/18/2023   Disposition:  Tiffany English presents for management of acute DVT/PE, likely provoked by oral contraceptive prescribed to manage IDA secondary to menorrhagia. Tolerating eliquis  BID. We recommend to complete 6 months of anticoagulation. We discussed indications for indefinite AC,  drawing hypercoagulable panel and D dimer once she has stopped Eliquis . She agrees with this plan.   Regarding anemia, there is no clinical evidence of support hematologic malignancy. Encouraged to f/up with ob/gyn for alternative ways to manage menorrhagia. We discussed newer evidence that suggests IDA as a potential risk factor for thrombosis. We will correct the iron deficiency. Today's hemoglobin has normalized and ferritin improved to 21, she is responding well thus far. Recommend to continue oral iron BID x3 months. She will also  continue oral B12 and folic acid .   We discussed other modifiable risk factors for thrombosis including obesity, no smoking, avoiding hormones, and prophylactic anticoagulation surrounding scheduled surgeries.   Return for lab and follow up in 3 months, or sooner if needed.   Patient seen with Dr. Cloretta.   Orders Placed This Encounter  Procedures   CBC with Differential (Cancer Center Only)    Standing Status:   Future    Number of Occurrences:   1    Expected Date:   01/04/2024    Expiration Date:   01/03/2025   Ferritin    Standing Status:   Future    Number of Occurrences:   1    Expected Date:   01/04/2024    Expiration Date:   01/03/2025   CBC with Differential (Cancer Center Only)    Standing Status:   Future    Expected Date:   04/05/2024    Expiration Date:   01/03/2025   Ferritin    Standing Status:   Future    Expected Date:   04/05/2024    Expiration Date:   01/03/2025   Vitamin B12    Standing Status:   Future    Expected Date:   04/05/2024    Expiration Date:   01/03/2025     All questions were answered. The patient knows to call the clinic with any problems, questions or concerns.      Tiffany Confer K Earnestine Shipp, Tiffany English  This was a shared visit with Ramses Klecka.  Tiffany English was interviewed and examined.  She was diagnosed with a right lower extremity DVT and acute bilateral pulmonary emboli last month.  She was treated with heparin  followed by apixaban .  Her symptoms have resolved.  The pulmonary embolism was likely provoked by oral contraceptive use.  Obesity and potentially iron deficiency anemia may have contributed. We recommend continuing apixaban  anticoagulation for at least 6 months.  She will continue oral iron replacement.  The plan is to obtain a hypercoagulation panel after 6 months of anticoagulation.  We will consider discontinue anticoagulant versus indefinite reduced intensity relation based on the hypercoagulable panel and her weight when she returns 6  months.  She understands to avoid hormone therapy.  She will alert treating physicians of her history of DVT/pulmonary embolism if she has future surgery or immobility.  She will follow-up with gynecology for management of menorrhagia.  The iron deficiency anemia is likely secondary to menorrhagia.  We will discuss indication for follow-up imaging of the paratracheal lymph node with radiology.  I was present for greater than 50% of today's visit.  I performed Medical Decision Making.  Arvella Hof, MD 01/04/24

## 2024-01-11 ENCOUNTER — Encounter: Payer: Self-pay | Admitting: Family Medicine

## 2024-01-12 ENCOUNTER — Encounter: Payer: Self-pay | Admitting: Nurse Practitioner

## 2024-01-19 ENCOUNTER — Other Ambulatory Visit: Payer: Self-pay | Admitting: Obstetrics and Gynecology

## 2024-01-30 ENCOUNTER — Telehealth: Payer: Self-pay

## 2024-01-30 NOTE — Telephone Encounter (Signed)
 Pt is scheduled to see Landry Ferrari, NP tomorrow for a Hospital f/u. Pt has never seen any provider in our office or hospital while admitted. Pt will have to see a MD first before seeing a NP. Pt can see a NP for a sleep consult, but not for pulm or a hospital f/u. Please have pt rescheduled to see a MD first or she can keep her appt tomorrow for a sleep consult only. This was also advised in a telephone encounter on 11-30-23.

## 2024-01-30 NOTE — Telephone Encounter (Signed)
 HFU okay to keep.

## 2024-01-31 ENCOUNTER — Encounter: Payer: Self-pay | Admitting: Primary Care

## 2024-01-31 ENCOUNTER — Ambulatory Visit (INDEPENDENT_AMBULATORY_CARE_PROVIDER_SITE_OTHER): Admitting: Primary Care

## 2024-01-31 VITALS — BP 124/70 | HR 94 | Temp 97.8°F | Ht 74.0 in | Wt 310.6 lb

## 2024-01-31 DIAGNOSIS — I2609 Other pulmonary embolism with acute cor pulmonale: Secondary | ICD-10-CM

## 2024-01-31 DIAGNOSIS — G4734 Idiopathic sleep related nonobstructive alveolar hypoventilation: Secondary | ICD-10-CM

## 2024-01-31 NOTE — Progress Notes (Signed)
 @Patient  ID: Tiffany English, female    DOB: [birth year], 38 y.o.   MRN: [id number]  No chief complaint on file.   Referring provider: Booker Darice SAUNDERS, FNP  HPI: 38 year old female, former smoker quit in 2020. PMH significant for acute pulmonary embolism, IBS, GERD, obesity.   01/31/2024- Interim hx  Discussed the use of AI scribe software for clinical note transcription with the patient, who gave verbal consent to proceed.  History of Present Illness   Tiffany English is a 38 year old female with a history of pulmonary embolism who presents for a follow-up visit.  In May, she was admitted for a massive pulmonary embolism.  Pulmonary embonism felt to be provoked due to oral contraceptive use. She is currently on Eliquis  5mg  twice daily for maintenance. She saw hematology/oncology on 6/25, they recommended patient complete 6 months of anticoagulation.  She has no respiratory symptoms, shortness of breath, or chest pain, and her oxygen saturation is consistently between 98 and 100%. She has not needed supplemental oxygen for several months.  She was previously on oral contraceptives for prolonged menstrual bleeding lasting four months, which were identified as a potential cause for the clot. She has discontinued the contraceptives and is scheduled for a gynecological procedure on August 15th to address the menstrual issues. Her iron levels are low, and she is taking iron supplements, B12, and folic acid .  During her hospital stay, a drop in oxygen levels prompted a recommendation for a sleep study. She has no symptoms of sleep apnea, such as snoring or waking up gasping. She occasionally wakes at night due to her animals but does not feel her sleep is significantly disturbed. Anxiety affects her sleep, but she feels rested with adequate sleep.  She works in [employer] at [employer] and has been working extensively.   No Known Allergies   There is no immunization history on file for this  patient.  Past Medical History:  Diagnosis Date   Anemia    Anxiety    Depression    Gallbladder polyp    GERD (gastroesophageal reflux disease)    IBS (irritable bowel syndrome)    Obesity     Tobacco History: Social History   Tobacco Use  Smoking Status Former   Current packs/day: 0.00   Average packs/day: 0.3 packs/day for 2.0 years (0.5 ttl pk-yrs)   Types: E-cigarettes, Cigarettes   Quit date: 02/10/2019   Years since quitting: 4.9  Smokeless Tobacco Not on file   Counseling given: Not Answered   Outpatient Medications Prior to Visit  Medication Sig Dispense Refill   acetaminophen  (TYLENOL ) 325 MG tablet Take 2 tablets (650 mg total) by mouth every 6 (six) hours as needed for mild pain (pain score 1-3) (or Fever >/= 101). 20 tablet 0   albuterol (VENTOLIN HFA) 108 (90 Base) MCG/ACT inhaler 2 puffs every 4 (four) hours as needed for shortness of breath or wheezing. (Patient not taking: Reported on 01/04/2024)     apixaban  (ELIQUIS ) 5 MG TABS tablet Take 1 tablet (5 mg total) by mouth 2 (two) times daily. 180 tablet 0   buPROPion  (WELLBUTRIN ) 100 MG tablet Take 100 mg by mouth daily.     cyanocobalamin  1000 MCG tablet Take 1 tablet (1,000 mcg total) by mouth daily. 30 tablet 0   escitalopram  (LEXAPRO ) 20 MG tablet Take 20 mg by mouth daily.     ferrous sulfate  325 (65 FE) MG tablet Take 1 tablet (325 mg total) by mouth  2 (two) times daily with a meal. 60 tablet 0   folic acid  (FOLVITE ) 1 MG tablet Take 1 tablet (1 mg total) by mouth daily. 30 tablet 0   lansoprazole (PREVACID) 30 MG capsule TAKE ONE CAPSULE BY MOUTH EVERY DAY 30 capsule 0   No facility-administered medications prior to visit.    Review of Systems  Review of Systems  Constitutional: Negative.  Negative for fatigue.  HENT: Negative.    Respiratory: Negative.  Negative for chest tightness, shortness of breath and wheezing.   Cardiovascular: Negative.  Negative for chest pain.     Physical  Exam  There were no vitals taken for this visit. Physical Exam Constitutional:      General: She is not in acute distress.    Appearance: Normal appearance. She is not ill-appearing.  HENT:     Head: Normocephalic and atraumatic.  Cardiovascular:     Rate and Rhythm: Normal rate and regular rhythm.  Pulmonary:     Effort: Pulmonary effort is normal.     Breath sounds: Normal breath sounds.  Musculoskeletal:        General: Normal range of motion.  Skin:    General: Skin is warm and dry.  Neurological:     General: No focal deficit present.     Mental Status: She is alert and oriented to person, place, and time. Mental status is at baseline.  Psychiatric:        Mood and Affect: Mood normal.        Behavior: Behavior normal.        Thought Content: Thought content normal.        Judgment: Judgment normal.      Lab Results:  CBC    Component Value Date/Time   WBC 6.4 01/04/2024 0945   WBC 8.6 11/21/2023 0512   RBC 5.23 (H) 01/04/2024 0945   HGB 12.5 01/04/2024 0945   HGB 11.0 (L) 11/28/2023 0952   HCT 40.2 01/04/2024 0945   HCT 38.6 11/28/2023 0952   PLT 333 01/04/2024 0945   PLT 585 (H) 11/28/2023 0952   MCV 76.9 (L) 01/04/2024 0945   MCV 74 (L) 11/28/2023 0952   MCH 23.9 (L) 01/04/2024 0945   MCHC 31.1 01/04/2024 0945   RDW 23.7 (H) 01/04/2024 0945   RDW 16.3 (H) 11/28/2023 0952   LYMPHSABS 2.2 01/04/2024 0945   MONOABS 0.4 01/04/2024 0945   EOSABS 0.1 01/04/2024 0945   BASOSABS 0.0 01/04/2024 0945    BMET    Component Value Date/Time   NA 136 11/28/2023 0952   K 4.8 11/28/2023 0952   CL 100 11/28/2023 0952   CO2 20 11/28/2023 0952   GLUCOSE 92 11/28/2023 0952   GLUCOSE 91 11/21/2023 0512   BUN 9 11/28/2023 0952   CREATININE 0.91 11/28/2023 0952   CALCIUM 9.7 11/28/2023 0952   GFRNONAA >60 11/21/2023 0512   GFRAA 161 05/12/2007 0854    BNP    Component Value Date/Time   BNP 327.5 (H) 11/19/2023 2147    ProBNP No results found for:  PROBNP  Imaging: No results found.   Assessment & Plan:   1. Nocturnal hypoxemia (Primary) - Pulse oximetry, overnight; Future  2. Other acute pulmonary embolism with acute cor pulmonale (HCC)   Assessment and Plan    Pulmonary embolism Pulmonary embolism diagnosed in May 2025 with evidence of heart strain, likely provoked by oral contraceptive use. Currently on Eliquis  for anticoagulation. No respiratory symptoms reported, and oxygen saturation is 98-100%.  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 Expect improvement as the embolism resolves. - Evaluate echocardiogram results to determine improvement  Iron deficiency anemia Iron deficiency anemia likely due to prolonged menstrual bleeding. Currently on iron supplementation, B12, and folic acid . - Continue iron supplementation, B12, and folic acid  - Continue to follow with hematology   Sleep apnea evaluation No current symptoms of sleep apnea reported. Overnight oximetry test recommended to assess nocturnal oxygen levels. - Perform overnight oximetry test on room air to assess nocturnal oxygen levels - Consider formal sleep study if oximetry test shows significant oxygen desaturation   Almarie LELON Ferrari, NP 01/31/2024

## 2024-01-31 NOTE — Patient Instructions (Signed)
  VISIT SUMMARY: Tiffany English, a 38 year old female with a history of pulmonary embolism, came in for a follow-up visit. She has been on Eliquis  since May and has no respiratory symptoms. She discontinued oral contraceptives, which were identified as a potential cause for the clot, and is scheduled for a gynecological procedure. Her iron levels are low, and she is taking supplements. A sleep study was recommended due to a drop in oxygen levels during her hospital stay, although she has no symptoms of sleep apnea.  YOUR PLAN: -PULMONARY EMBOLISM: A pulmonary embolism is a blood clot in the lungs. You will continue taking Eliquis  for six months to prevent further clots. We will consult with hematology about the duration of your anticoagulation therapy and discuss stopping Eliquis  for your upcoming gynecological procedure. An echocardiogram will be ordered to assess your heart and lung pressure.  -HEART STRAIN DUE TO PULMONARY EMBOLISM: Heart strain can occur due to a blood clot in the lungs. We expect improvement as the clot resolves. We will evaluate the echocardiogram results to guide your treatment.  -PULMONARY HYPERTENSION SECONDARY TO PULMONARY EMBOLISM: Pulmonary hypertension is high blood pressure in the lungs, often caused by a blood clot. We expect improvement as the clot resolves. We will evaluate the echocardiogram results to monitor your condition.  -IRON DEFICIENCY ANEMIA: Iron deficiency anemia is a condition where you lack enough iron in your blood, often due to prolonged menstrual bleeding. Continue taking your iron supplements, B12, and folic acid .  -SLEEP APNEA EVALUATION: Sleep apnea is a condition where breathing repeatedly stops and starts during sleep. Although you have no symptoms, an overnight oximetry test is recommended to check your oxygen levels at night. If the test shows significant drops in oxygen, a formal sleep study may be needed.  INSTRUCTIONS: 1. Continue taking  Eliquis  as prescribed. 2. Schedule and complete the echocardiogram. 3. Consult with hematology regarding the duration of anticoagulation and stopping Eliquis  for your gynecological procedure. 4. Continue taking iron supplements, B12, and folic acid . 5. Perform the overnight oximetry test to assess your nocturnal oxygen levels. 6. Follow up with your doctor to discuss the results of the echocardiogram and oximetry test.  Follow-up November with Dr. Annella or APP

## 2024-02-20 ENCOUNTER — Encounter (HOSPITAL_COMMUNITY): Payer: Self-pay | Admitting: Obstetrics and Gynecology

## 2024-02-20 NOTE — Progress Notes (Signed)
 Spoke w/ via phone for pre-op interview--- pt Lab needs dos----    cbc/ t&s/ upr     Lab results------ no COVID test -----patient states asymptomatic no test needed Arrive at ------- 0700 on 02-24-2024 NPO after MN w/ exception sips of water  w/ meds Pre-Surgery Ensure or G2:  n/a  Med rec completed Medications to take morning of surgery ----- wellbutrin , lexapro , prevacid Diabetic medication ----- n/a  GLP1 agonist last dose: n/a GLP1 instructions:  Patient instructed no nail polish to be worn day of surgery Patient instructed to bring photo id and insurance card day of surgery Patient aware to have Driver (ride ) / caregiver    for 24 hours after surgery - mother, laura Tufaro Patient Special Instructions ----- antibacterial soap shower morning of surgery Pre-Op special Instructions ----- pt stated was given instructions by dr armond to stop eliquis  2 days prior to surgery.  Office has sent request for clearance to hematology today for eliquis  and then will be faxed to us  will be in epic scanned under media  Patient verbalized understanding of instructions that were given at this phone interview. Patient denies chest pain, sob, fever, cough at the interview.

## 2024-02-22 ENCOUNTER — Encounter: Payer: Self-pay | Admitting: *Deleted

## 2024-02-22 NOTE — Progress Notes (Unsigned)
 [facility] sent letter requesting last office note and surgical clearance letter on 02/21/24 for her surgery on 02/24/24.  Called office and LVM for surgical coordinator, Deon Berg that MD will not be able to dictate letter by the time requested. He would like to speak to the surgeon personally to discuss her case. Provided phone # for [employer].

## 2024-02-24 ENCOUNTER — Encounter (HOSPITAL_COMMUNITY)
Admission: RE | Disposition: A | Discharge status: Home / Self Care | Payer: Self-pay | Source: Home / Self Care | Attending: Obstetrics and Gynecology

## 2024-02-24 ENCOUNTER — Ambulatory Visit (HOSPITAL_COMMUNITY): Admitting: Anesthesiology

## 2024-02-24 ENCOUNTER — Ambulatory Visit (HOSPITAL_COMMUNITY)
Admission: RE | Admit: 2024-02-24 | Discharge: 2024-02-24 | Disposition: A | Discharge status: Home / Self Care | Attending: Obstetrics and Gynecology | Admitting: Obstetrics and Gynecology

## 2024-02-24 ENCOUNTER — Encounter (HOSPITAL_COMMUNITY): Payer: Self-pay | Admitting: Obstetrics and Gynecology

## 2024-02-24 ENCOUNTER — Other Ambulatory Visit: Payer: Self-pay

## 2024-02-24 DIAGNOSIS — Z86711 Personal history of pulmonary embolism: Secondary | ICD-10-CM | POA: Diagnosis not present

## 2024-02-24 DIAGNOSIS — Z01818 Encounter for other preprocedural examination: Secondary | ICD-10-CM

## 2024-02-24 DIAGNOSIS — E669 Obesity, unspecified: Secondary | ICD-10-CM | POA: Insufficient documentation

## 2024-02-24 DIAGNOSIS — Z87891 Personal history of nicotine dependence: Secondary | ICD-10-CM | POA: Diagnosis not present

## 2024-02-24 DIAGNOSIS — Z7901 Long term (current) use of anticoagulants: Secondary | ICD-10-CM | POA: Diagnosis not present

## 2024-02-24 DIAGNOSIS — Z6839 Body mass index (BMI) 39.0-39.9, adult: Secondary | ICD-10-CM | POA: Diagnosis not present

## 2024-02-24 DIAGNOSIS — F418 Other specified anxiety disorders: Secondary | ICD-10-CM

## 2024-02-24 DIAGNOSIS — E66813 Obesity, class 3: Secondary | ICD-10-CM | POA: Diagnosis not present

## 2024-02-24 DIAGNOSIS — Z86718 Personal history of other venous thrombosis and embolism: Secondary | ICD-10-CM | POA: Diagnosis not present

## 2024-02-24 DIAGNOSIS — N926 Irregular menstruation, unspecified: Secondary | ICD-10-CM | POA: Insufficient documentation

## 2024-02-24 HISTORY — PX: DILATION AND CURETTAGE OF UTERUS: SHX78

## 2024-02-24 HISTORY — PX: HYDROTHERMAL ABLATION/NOVASURE: SHX7586

## 2024-02-24 HISTORY — DX: Deficiency of other specified B group vitamins: E53.8

## 2024-02-24 HISTORY — DX: Iron deficiency anemia secondary to blood loss (chronic): D50.0

## 2024-02-24 HISTORY — PX: HYSTEROSCOPY: SHX211

## 2024-02-24 HISTORY — DX: Long term (current) use of anticoagulants: Z79.01

## 2024-02-24 HISTORY — DX: Irregular menstruation, unspecified: N92.6

## 2024-02-24 HISTORY — DX: Abnormal uterine and vaginal bleeding, unspecified: N93.9

## 2024-02-24 LAB — TYPE AND SCREEN
ABO/RH(D): A POS
Antibody Screen: NEGATIVE

## 2024-02-24 LAB — CBC
HCT: 38.7 % (ref 36.0–46.0)
Hemoglobin: 10.6 g/dL — ABNORMAL LOW (ref 12.0–15.0)
MCH: 24.4 pg — ABNORMAL LOW (ref 26.0–34.0)
MCHC: 27.4 g/dL — ABNORMAL LOW (ref 30.0–36.0)
MCV: 89 fL (ref 80.0–100.0)
Platelets: 327 K/uL (ref 150–400)
RBC: 4.35 MIL/uL (ref 3.87–5.11)
RDW: 16.8 % — ABNORMAL HIGH (ref 11.5–15.5)
WBC: 5.9 K/uL (ref 4.0–10.5)
nRBC: 0 % (ref 0.0–0.2)

## 2024-02-24 LAB — POCT PREGNANCY, URINE: Preg Test, Ur: NEGATIVE

## 2024-02-24 LAB — ABO/RH: ABO/RH(D): A POS

## 2024-02-24 SURGERY — ABLATION, ENDOMETRIUM, HYSTEROSCOPIC
Anesthesia: General | Site: Vagina

## 2024-02-24 MED ORDER — ENOXAPARIN SODIUM 40 MG/0.4ML IJ SOSY
PREFILLED_SYRINGE | INTRAMUSCULAR | Status: AC
  Filled 2024-02-24: qty 0.4

## 2024-02-24 MED ORDER — SODIUM CHLORIDE 0.9 % IR SOLN
Status: DC | PRN
  Administered 2024-02-24: 3000 mL

## 2024-02-24 MED ORDER — OXYCODONE HCL 5 MG PO TABS
5.0000 mg | ORAL_TABLET | Freq: Once | ORAL | Status: AC | PRN
  Administered 2024-02-24: 5 mg via ORAL

## 2024-02-24 MED ORDER — LACTATED RINGERS IV SOLN: INTRAVENOUS | Status: DC

## 2024-02-24 MED ORDER — POVIDONE-IODINE 10 % EX SWAB: 2.0000 | Freq: Once | CUTANEOUS | Status: DC

## 2024-02-24 MED ORDER — OXYCODONE HCL 5 MG/5ML PO SOLN: 5.0000 mg | Freq: Once | ORAL | Status: AC | PRN

## 2024-02-24 MED ORDER — OXYCODONE HCL 5 MG PO TABS: 5.0000 mg | ORAL_TABLET | Freq: Four times a day (QID) | ORAL | 0 refills | Status: DC | PRN

## 2024-02-24 MED ORDER — LIDOCAINE HCL 2 % IJ SOLN
INTRAMUSCULAR | Status: DC | PRN
  Administered 2024-02-24: 10 mL

## 2024-02-24 MED ORDER — SILVER NITRATE-POT NITRATE 75-25 % EX MISC
CUTANEOUS | Status: DC | PRN
  Administered 2024-02-24: 2

## 2024-02-24 MED ORDER — DEXAMETHASONE SODIUM PHOSPHATE 10 MG/ML IJ SOLN
INTRAMUSCULAR | Status: DC | PRN
  Administered 2024-02-24: 10 mg via INTRAVENOUS

## 2024-02-24 MED ORDER — CHLORHEXIDINE GLUCONATE 0.12 % MT SOLN
15.0000 mL | Freq: Once | OROMUCOSAL | Status: AC
  Administered 2024-02-24: 15 mL via OROMUCOSAL

## 2024-02-24 MED ORDER — CELECOXIB 200 MG PO CAPS: 200.0000 mg | ORAL_CAPSULE | Freq: Once | ORAL | Status: DC

## 2024-02-24 MED ORDER — ACETAMINOPHEN 500 MG PO TABS
1000.0000 mg | ORAL_TABLET | Freq: Once | ORAL | Status: AC
  Administered 2024-02-24: 1000 mg via ORAL

## 2024-02-24 MED ORDER — OXYCODONE HCL 5 MG PO TABS
ORAL_TABLET | ORAL | Status: AC
  Filled 2024-02-24: qty 1

## 2024-02-24 MED ORDER — ONDANSETRON HCL 4 MG/2ML IJ SOLN
INTRAMUSCULAR | Status: DC | PRN
  Administered 2024-02-24: 4 mg via INTRAVENOUS

## 2024-02-24 MED ORDER — PROPOFOL 10 MG/ML IV BOLUS
INTRAVENOUS | Status: AC
  Filled 2024-02-24: qty 20

## 2024-02-24 MED ORDER — ACETAMINOPHEN 500 MG PO TABS
ORAL_TABLET | ORAL | Status: DC
Start: 2024-02-24 — End: 2024-02-24
  Filled 2024-02-24: qty 2

## 2024-02-24 MED ORDER — DROPERIDOL 2.5 MG/ML IJ SOLN: 0.6250 mg | Freq: Once | INTRAMUSCULAR | Status: DC | PRN

## 2024-02-24 MED ORDER — LIDOCAINE 2% (20 MG/ML) 5 ML SYRINGE
INTRAMUSCULAR | Status: DC | PRN
  Administered 2024-02-24: 80 mg via INTRAVENOUS

## 2024-02-24 MED ORDER — CHLORHEXIDINE GLUCONATE 0.12 % MT SOLN
OROMUCOSAL | Status: AC
  Filled 2024-02-24: qty 15

## 2024-02-24 MED ORDER — MIDAZOLAM HCL 2 MG/2ML IJ SOLN
INTRAMUSCULAR | Status: AC
  Filled 2024-02-24: qty 2

## 2024-02-24 MED ORDER — ORAL CARE MOUTH RINSE: 15.0000 mL | Freq: Once | OROMUCOSAL | Status: AC

## 2024-02-24 MED ORDER — FENTANYL CITRATE (PF) 250 MCG/5ML IJ SOLN
INTRAMUSCULAR | Status: AC
  Filled 2024-02-24: qty 5

## 2024-02-24 MED ORDER — LIDOCAINE 2% (20 MG/ML) 5 ML SYRINGE
INTRAMUSCULAR | Status: AC
  Filled 2024-02-24: qty 5

## 2024-02-24 MED ORDER — KETOROLAC TROMETHAMINE 30 MG/ML IJ SOLN
INTRAMUSCULAR | Status: DC | PRN
Start: 2024-02-24 — End: 2024-02-24
  Administered 2024-02-24: 30 mg via INTRAVENOUS

## 2024-02-24 MED ORDER — LIDOCAINE HCL 2 % IJ SOLN
INTRAMUSCULAR | Status: AC
  Filled 2024-02-24: qty 20

## 2024-02-24 MED ORDER — ENOXAPARIN SODIUM 40 MG/0.4ML IJ SOSY
40.0000 mg | PREFILLED_SYRINGE | Freq: Once | INTRAMUSCULAR | Status: AC
  Administered 2024-02-24: 40 mg via SUBCUTANEOUS

## 2024-02-24 MED ORDER — FENTANYL CITRATE (PF) 250 MCG/5ML IJ SOLN
INTRAMUSCULAR | Status: DC | PRN
Start: 2024-02-24 — End: 2024-02-24
  Administered 2024-02-24 (×2): 50 ug via INTRAVENOUS

## 2024-02-24 MED ORDER — PROPOFOL 10 MG/ML IV BOLUS
INTRAVENOUS | Status: DC | PRN
  Administered 2024-02-24: 200 mg via INTRAVENOUS

## 2024-02-24 MED ORDER — MIDAZOLAM HCL 2 MG/2ML IJ SOLN
INTRAMUSCULAR | Status: DC | PRN
  Administered 2024-02-24: 2 mg via INTRAVENOUS

## 2024-02-24 MED ORDER — HYDROMORPHONE HCL 1 MG/ML IJ SOLN: 0.2500 mg | INTRAMUSCULAR | Status: DC | PRN

## 2024-02-24 SURGICAL SUPPLY — 12 items
CATH ROBINSON RED A/P 16FR (CATHETERS) IMPLANT
COVER MAYO STAND STRL (DRAPES) ×2 IMPLANT
DILATOR CANAL MILEX (MISCELLANEOUS) IMPLANT
GLOVE BIO SURGEON STRL SZ 6.5 (GLOVE) ×2 IMPLANT
GLOVE SURG UNDER POLY LF SZ7 (GLOVE) ×4 IMPLANT
GOWN STRL REUS W/ TWL LRG LVL3 (GOWN DISPOSABLE) ×4 IMPLANT
PACK VAGINAL MINOR WOMEN LF (CUSTOM PROCEDURE TRAY) ×2 IMPLANT
PAD OB MATERNITY 11 LF (PERSONAL CARE ITEMS) ×2 IMPLANT
SET GENESYS HTA PROCERVA (MISCELLANEOUS) ×2 IMPLANT
SOL .9 NS 3000ML IRR UROMATIC (IV SOLUTION) IMPLANT
TOWEL GREEN STERILE FF (TOWEL DISPOSABLE) ×2 IMPLANT
UNDERPAD 30X36 HEAVY ABSORB (UNDERPADS AND DIAPERS) ×2 IMPLANT

## 2024-02-24 NOTE — Op Note (Signed)
 Preop Diagnosis: Irregular menstrual cycle   Postop Diagnosis: Irregular menstrual cycle   Procedure: PR HYSTEROSCOPY ENDOMETRIAL ABLATION [41436] PR HYSTEROSCOPY ENDOMETRIAL ABLATION [41436]   Anesthesia: General   Anesthesiologist: No responsible provider has been recorded for the case.   Attending: Nemesio Castrillon, MD   Assistant: none  Findings: fluffy endometrium.  Uterus 8 week size.    Pathology:  [facility]  Fluids: 400 cc  UOP: 200 cc  EBL: 50 ml  Complications: none  Procedure: The patient was taken to the operating room after risks benefits and alternatives were discussed with patient, the patient verbalized understanding and consent signed and witnessed. The patient was placed under general anesthesia and prepped and draped in normal sterile fashion. A bivalve speculum was placed in the patient's vagina and the anterior lip of the cervix was grasped with a single-tooth tenaculum. The cervix was dilated for passage of the hysteroscope. The uterus sounded to 8cm.  The hysteroscope was introduced into the uterine cavity with findings as noted above. A curettage was performed and currettings sent to pathology. The hysteroscope was reintroduced and hydrothermal ablation was performed without difficulty. The tenaculum and bivalve speculum were removed and there was good hemostasis at the tenaculum sites. Sponge lap and needle count was correct. The patient tolerated procedure well and was returned to the recovery room in good condition.

## 2024-02-24 NOTE — Anesthesia Preprocedure Evaluation (Addendum)
 Anesthesia Evaluation  Patient identified by MRN, date of birth, ID band Patient awake    Reviewed: Allergy & Precautions, NPO status , Patient's Chart, lab work & pertinent test results  Airway Mallampati: II  TM Distance: >3 FB Neck ROM: Full    Dental no notable dental hx.    Pulmonary former smoker, PE (05/25, on Eliquis , held appropriately)   Pulmonary exam normal        Cardiovascular negative cardio ROS  Rhythm:Regular Rate:Normal     Neuro/Psych   Anxiety Depression    negative neurological ROS     GI/Hepatic Neg liver ROS,GERD  ,,  Endo/Other    Class 3 obesity  Renal/GU negative Renal ROS  Female GU complaint     Musculoskeletal negative musculoskeletal ROS (+)    Abdominal  (+) + obese  Peds  Hematology  (+) Blood dyscrasia, anemia   Anesthesia Other Findings   Reproductive/Obstetrics                              Anesthesia Physical Anesthesia Plan  ASA: 3  Anesthesia Plan: General   Post-op Pain Management: Tylenol  PO (pre-op)*   Induction: Intravenous  PONV Risk Score and Plan: 3 and Ondansetron , Dexamethasone , Midazolam  and Treatment may vary due to age or medical condition  Airway Management Planned: Mask and LMA  Additional Equipment: None  Intra-op Plan:   Post-operative Plan: Extubation in OR  Informed Consent: I have reviewed the patients History and Physical, chart, labs and discussed the procedure including the risks, benefits and alternatives for the proposed anesthesia with the patient or authorized representative who has indicated his/her understanding and acceptance.     Dental advisory given  Plan Discussed with: CRNA  Anesthesia Plan Comments:          Anesthesia Quick Evaluation

## 2024-02-24 NOTE — Anesthesia Postprocedure Evaluation (Signed)
 Anesthesia Post Note  Patient: Tiffany English  Procedure(s) Performed: ABLATION, ENDOMETRIUM, HYSTEROSCOPIC (Pelvis) HYDROTHERMAL ABLATION (Pelvis) DILATION AND CURETTAGE (Vagina )     Patient location during evaluation: PACU Anesthesia Type: General Level of consciousness: awake and alert Pain management: pain level controlled Vital Signs Assessment: post-procedure vital signs reviewed and stable Respiratory status: spontaneous breathing, nonlabored ventilation, respiratory function stable and patient connected to nasal cannula oxygen Cardiovascular status: blood pressure returned to baseline and stable Postop Assessment: no apparent nausea or vomiting Anesthetic complications: no   No notable events documented.  Last Vitals:  Vitals:   02/24/24 1045 02/24/24 1107  BP: (!) 145/98 (!) 143/86  Pulse: 88 92  Resp: 16 20  Temp:  36.7 C  SpO2: 98% 100%    Last Pain:  Vitals:   02/24/24 1103  TempSrc:   PainSc: 4                  [place] [address] [place]

## 2024-02-24 NOTE — Anesthesia Procedure Notes (Signed)
 Procedure Name: LMA Insertion Date/Time: 02/24/2024 9:21 AM  Performed by: Eudell Julian J, CRNAPre-anesthesia Checklist: Patient identified, Emergency Drugs available, Suction available and Patient being monitored Patient Re-evaluated:Patient Re-evaluated prior to induction Oxygen Delivery Method: Circle System Utilized Preoxygenation: Pre-oxygenation with 100% oxygen Induction Type: IV induction Ventilation: Mask ventilation without difficulty LMA: LMA with gastric port inserted LMA Size: 4.0 Number of attempts: 1 Airway Equipment and Method: Bite block Placement Confirmation: positive ETCO2 Tube secured with: Tape Dental Injury: Teeth and Oropharynx as per pre-operative assessment

## 2024-02-24 NOTE — Transfer of Care (Signed)
 Immediate Anesthesia Transfer of Care Note  Patient: Tiffany English  Procedure(s) Performed: ABLATION, ENDOMETRIUM, HYSTEROSCOPIC (Pelvis) HYDROTHERMAL ABLATION (Pelvis) DILATION AND CURETTAGE (Vagina )  Patient Location: PACU  Anesthesia Type:General  Level of Consciousness: awake, alert , and oriented  Airway & Oxygen Therapy: Patient Spontanous Breathing and Patient connected to face mask oxygen  Post-op Assessment: Report given to RN and Post -op Vital signs reviewed and stable  Post vital signs: Reviewed and stable  Last Vitals:  Vitals Value Taken Time  BP 134/85 02/24/24 10:18  Temp    Pulse 94 02/24/24 10:20  Resp 22 02/24/24 10:20  SpO2 100 % 02/24/24 10:20  Vitals shown include unfiled device data.  Last Pain:  Vitals:   02/24/24 0713  TempSrc: Oral  PainSc: 0-No pain      Patients Stated Pain Goal: 6 (02/24/24 0713)  Complications: No notable events documented.

## 2024-02-24 NOTE — Discharge Instructions (Signed)
     No ibuprofen, Advil, Aleve, Motrin, ketorolac, meloxicam, naproxen, or other NSAIDS until after 4:00 pm today if needed.     Post Anesthesia Home Care Instructions  Activity: Get plenty of rest for the remainder of the day. A responsible individual must stay with you for 24 hours following the procedure.  For the next 24 hours, DO NOT: -Drive a car -Operate machinery -Drink alcoholic beverages -Take any medication unless instructed by your physician -Make any legal decisions or sign important papers.  Meals: Start with liquid foods such as gelatin or soup. Progress to regular foods as tolerated. Avoid greasy, spicy, heavy foods. If nausea and/or vomiting occur, drink only clear liquids until the nausea and/or vomiting subsides. Call your physician if vomiting continues.  Special Instructions/Symptoms: Your throat may feel dry or sore from the anesthesia or the breathing tube placed in your throat during surgery. If this causes discomfort, gargle with warm salt water. The discomfort should disappear within 24 hours.  

## 2024-02-24 NOTE — H&P (Signed)
 Tiffany English is an 38 y.o. female. Presents for endometrial ablation.  She has had irreg menses for years.  She was on OCPS and had developed a PE so had to gon eliquist and desires ablation.  She is not sexually active and does not desire children.  She understands if she becomes sexually active she will need non hormonal BC  Pertinent Gynecological History: Menses: pt has bleeding off and on sometimes heavy.  She is anemic Bleeding: intermenstrual bleeding Contraception: abstinence DES exposure: unknown Blood transfusions: none Sexually transmitted diseases: no past history Previous GYN Procedures:    Last mammogram: na Date:  Last pap: normal Date: 2023 OB History: G0, P0   Menstrual History: Menarche age: 24 Patient's last menstrual period was 02/13/2024 (approximate).    Past Medical History:  Diagnosis Date   Abnormal uterine bleeding (AUB)    Anticoagulated    eliquis --- started 05/ 2025 for PE/ DVT ;  managed by hematology/ oncology,  6 month therapy   Anxiety    B12 deficiency    Depression    Folic acid  deficiency    Gallbladder polyp    GERD (gastroesophageal reflux disease)    History of pulmonary embolus (PE) 11/18/2023   (02-20-2024 pt stated never had clots prior )  ED/ admission;  CP/ SOB  dx acute bilateral submassive PE/ right LLE DVTs (popliteal/ posterior tibial/ peroneal veins);   likely provoked on OCP, d/cd on eliquis --- managed by oncology/ hematology and follow-up w/ pulmonary   IBS (irritable bowel syndrome)    Iron deficiency anemia due to chronic blood loss    hematology--- lacie burton NP   Irregular menses    Obesity     Past Surgical History:  Procedure Laterality Date   COLONOSCOPY WITH ESOPHAGOGASTRODUODENOSCOPY (EGD)  2018   NO PAST SURGERIES     WISDOM TOOTH EXTRACTION  2017    Family History  Problem Relation Age of Onset   Colon cancer Other        uncle   Colon cancer Other        grandfather   Prostate cancer Other         uncle   Liver disease Other        great grandmother   Anxiety disorder Mother    Asthma Mother    COPD Mother    Depression Mother    Obesity Mother    Obesity Father    Diabetes Maternal Grandmother    Diabetes Paternal Grandmother    Varicose Veins Paternal Grandmother    Cancer Maternal Uncle    Cancer Maternal Aunt     Social History:  reports that she has quit smoking. Her smoking use included cigarettes. She does not have any smokeless tobacco history on file. She reports current alcohol use of about 7.0 - 8.0 standard drinks of alcohol per week. She reports that she does not currently use drugs.  Allergies: No Known Allergies  Medications Prior to Admission  Medication Sig Dispense Refill Last Dose/Taking   acetaminophen  (TYLENOL ) 500 MG tablet Take 500 mg by mouth every 6 (six) hours as needed.   02/23/2024   apixaban  (ELIQUIS ) 5 MG TABS tablet Take 1 tablet (5 mg total) by mouth 2 (two) times daily. (Patient taking differently: Take 5 mg by mouth 2 (two) times daily.) 180 tablet 0 02/21/2024 Evening   buPROPion  (WELLBUTRIN ) 100 MG tablet Take 100 mg by mouth daily.   02/24/2024 at  5:45 AM   cyanocobalamin  1000 MCG  tablet Take 1 tablet (1,000 mcg total) by mouth daily. (Patient taking differently: Take 1,000 mcg by mouth daily after lunch.) 30 tablet 0 02/23/2024   escitalopram  (LEXAPRO ) 20 MG tablet Take 20 mg by mouth daily.   02/24/2024 at  5:45 AM   ferrous sulfate  325 (65 FE) MG tablet Take 1 tablet (325 mg total) by mouth 2 (two) times daily with a meal. (Patient taking differently: Take 325 mg by mouth daily after lunch.) 60 tablet 0 02/23/2024   folic acid  (FOLVITE ) 1 MG tablet Take 1 tablet (1 mg total) by mouth daily. (Patient taking differently: Take 1 mg by mouth daily.) 30 tablet 0 02/23/2024   lansoprazole (PREVACID) 30 MG capsule TAKE ONE CAPSULE BY MOUTH EVERY DAY (Patient taking differently: Take 30 mg by mouth daily.) 30 capsule 0 02/24/2024 at  5:45 AM    Review  of Systems  Blood pressure (!) 155/93, pulse 90, temperature 97.6 F (36.4 C), temperature source Oral, resp. rate 18, height 6' 2 (1.88 m), weight (!) 140.6 kg, last menstrual period 02/13/2024, SpO2 100%. Physical Exam Physical Examination: General appearance - alert, well appearing, and in no distress Heart - normal rate and regular rhythm Abdomen - soft, nontender, nondistended, no masses or organomegaly Pelvic - VULVA: normal appearing vulva with no masses, tenderness or lesions, VAGINA: normal appearing vagina with normal color and discharge, no lesions, CERVIX: normal appearing cervix without discharge or lesions, UTERUS: enlarged to 8 week's size, ADNEXA: normal adnexa in size, nontender and no masses Extremities - Homan's sign negative bilaterally   Results for orders placed or performed during the hospital encounter of 02/24/24 (from the past 24 hours)  Pregnancy, urine POC     Status: None   Collection Time: 02/24/24  6:58 AM  Result Value Ref Range   Preg Test, Ur NEGATIVE NEGATIVE  CBC     Status: Abnormal   Collection Time: 02/24/24  7:47 AM  Result Value Ref Range   WBC 5.9 4.0 - 10.5 K/uL   RBC 4.35 3.87 - 5.11 MIL/uL   Hemoglobin 10.6 (L) 12.0 - 15.0 g/dL   HCT 61.2 63.9 - 53.9 %   MCV 89.0 80.0 - 100.0 fL   MCH 24.4 (L) 26.0 - 34.0 pg   MCHC 27.4 (L) 30.0 - 36.0 g/dL   RDW 83.1 (H) 88.4 - 84.4 %   Platelets 327 150 - 400 K/uL   nRBC 0.0 0.0 - 0.2 %  Type and screen     Status: None (Preliminary result)   Collection Time: 02/24/24  7:47 AM  Result Value Ref Range   ABO/RH(D) PENDING    Antibody Screen PENDING    Sample Expiration      02/27/2024,2359 Performed at [facility], [address] [place], [place] [address]   ABO/Rh     Status: None (Preliminary result)   Collection Time: 02/24/24  7:54 AM  Result Value Ref Range   ABO/RH(D) PENDING     No results found.  Assessment/Plan: Date of Initial H&P: 8/15  History reviewed, patient  examined, no change in status, stable for surgery.  Pt for hydrothermal ablation for irref bleeding and fibroids.  She understands the risks are but not limited to bleeding, infection damage to internal organs like bowel and bladder.   Also vaginal and perineal burns.   Presley Summerlin A Geraldine Sandberg 02/24/2024, 8:58 AM

## 2024-02-25 ENCOUNTER — Encounter (HOSPITAL_COMMUNITY): Payer: Self-pay | Admitting: Obstetrics and Gynecology

## 2024-02-27 LAB — SURGICAL PATHOLOGY

## 2024-02-28 ENCOUNTER — Other Ambulatory Visit: Payer: Self-pay | Admitting: Family Medicine

## 2024-02-28 DIAGNOSIS — I2699 Other pulmonary embolism without acute cor pulmonale: Secondary | ICD-10-CM

## 2024-03-26 ENCOUNTER — Telehealth: Payer: Self-pay | Admitting: Nurse Practitioner

## 2024-03-28 ENCOUNTER — Inpatient Hospital Stay: Admitting: Nurse Practitioner

## 2024-03-28 ENCOUNTER — Inpatient Hospital Stay

## 2024-04-10 ENCOUNTER — Emergency Department (HOSPITAL_COMMUNITY): Admission: EM | Admit: 2024-04-10 | Discharge: 2024-04-10 | Disposition: A | Discharge status: Home / Self Care

## 2024-04-10 ENCOUNTER — Other Ambulatory Visit: Payer: Self-pay

## 2024-04-10 DIAGNOSIS — Z7901 Long term (current) use of anticoagulants: Secondary | ICD-10-CM | POA: Insufficient documentation

## 2024-04-10 DIAGNOSIS — N939 Abnormal uterine and vaginal bleeding, unspecified: Secondary | ICD-10-CM | POA: Diagnosis present

## 2024-04-10 DIAGNOSIS — Z86711 Personal history of pulmonary embolism: Secondary | ICD-10-CM | POA: Insufficient documentation

## 2024-04-10 LAB — CBC WITH DIFFERENTIAL/PLATELET
Abs Immature Granulocytes: 0.02 K/uL (ref 0.00–0.07)
Basophils Absolute: 0.1 K/uL (ref 0.0–0.1)
Basophils Relative: 1 %
Eosinophils Absolute: 0.1 K/uL (ref 0.0–0.5)
Eosinophils Relative: 1 %
HCT: 33.4 % — ABNORMAL LOW (ref 36.0–46.0)
Hemoglobin: 10.1 g/dL — ABNORMAL LOW (ref 12.0–15.0)
Immature Granulocytes: 0 %
Lymphocytes Relative: 26 %
Lymphs Abs: 1.9 K/uL (ref 0.7–4.0)
MCH: 25.2 pg — ABNORMAL LOW (ref 26.0–34.0)
MCHC: 30.2 g/dL (ref 30.0–36.0)
MCV: 83.3 fL (ref 80.0–100.0)
Monocytes Absolute: 0.4 K/uL (ref 0.1–1.0)
Monocytes Relative: 5 %
Neutro Abs: 4.8 K/uL (ref 1.7–7.7)
Neutrophils Relative %: 67 %
Platelets: 311 K/uL (ref 150–400)
RBC: 4.01 MIL/uL (ref 3.87–5.11)
RDW: 16.8 % — ABNORMAL HIGH (ref 11.5–15.5)
WBC: 7.2 K/uL (ref 4.0–10.5)
nRBC: 0 % (ref 0.0–0.2)

## 2024-04-10 LAB — BASIC METABOLIC PANEL WITH GFR
Anion gap: 11 (ref 5–15)
BUN: 12 mg/dL (ref 6–20)
CO2: 20 mmol/L — ABNORMAL LOW (ref 22–32)
Calcium: 8.5 mg/dL — ABNORMAL LOW (ref 8.9–10.3)
Chloride: 103 mmol/L (ref 98–111)
Creatinine, Ser: 0.88 mg/dL (ref 0.44–1.00)
GFR, Estimated: >60 mL/min (ref >60)
Glucose, Bld: 104 mg/dL — ABNORMAL HIGH (ref 70–99)
Potassium: 4.7 mmol/L (ref 3.5–5.1)
Sodium: 134 mmol/L — ABNORMAL LOW (ref 135–145)

## 2024-04-10 LAB — URINALYSIS, ROUTINE W REFLEX MICROSCOPIC: Nitrite: NEGATIVE

## 2024-04-10 LAB — URINALYSIS, MICROSCOPIC (REFLEX): RBC / HPF: >50 RBC/hpf (ref 0–5)

## 2024-04-10 LAB — HCG, SERUM, QUALITATIVE: Preg, Serum: NEGATIVE

## 2024-04-10 NOTE — Discharge Instructions (Signed)
 Go to your appointment with your OB/GYN tomorrow morning as scheduled.  Return to the ER for new or worsening symptoms.

## 2024-04-10 NOTE — ED Provider Notes (Signed)
 [place] EMERGENCY DEPARTMENT AT [facility] Provider Note   CSN: [id number] Arrival date & time: 04/10/24  9157     Patient presents with: Vaginal Bleeding   Tiffany English is a 38 y.o. female.   38 year old female presents for vaginal bleeding.  She states it started 5 days ago But has been worse the last 2 days.  She states that she has been using a pad every hour for the last 2 days.  Does have a history of anemia.  Had an ablation about 6 weeks ago and thinks that the bleeding may be from her fibroids at this time.  She has a history of PE and is on Eliquis .  She denies any lightheadedness or dizziness or any other symptoms or concerns at this time.  Did call her OB/GYN and she has a follow-up appointment tomorrow morning at 10 AM that is scheduled currently.   Vaginal Bleeding Associated symptoms: no abdominal pain, no back pain, no dysuria and no fever        Prior to Admission medications   Medication Sig Start Date End Date Taking? Authorizing Provider  acetaminophen  (TYLENOL ) 500 MG tablet Take 500 mg by mouth every 6 (six) hours as needed.    [provider]  apixaban  (ELIQUIS ) 5 MG TABS tablet Take 1 tablet (5 mg total) by mouth 2 (two) times daily. 02/28/24   Booker Darice SAUNDERS, FNP  buPROPion  (WELLBUTRIN ) 100 MG tablet Take 100 mg by mouth daily. 10/31/23   [provider]  cyanocobalamin  1000 MCG tablet Take 1 tablet (1,000 mcg total) by mouth daily. Patient taking differently: Take 1,000 mcg by mouth daily after lunch. 11/23/23   Singh, Prashant K, MD  escitalopram  (LEXAPRO ) 20 MG tablet Take 20 mg by mouth daily.    [provider]  ferrous sulfate  325 (65 FE) MG tablet Take 1 tablet (325 mg total) by mouth 2 (two) times daily with a meal. Patient taking differently: Take 325 mg by mouth daily after lunch. 11/21/23   Singh, Prashant K, MD  folic acid  (FOLVITE ) 1 MG tablet Take 1 tablet (1 mg total) by mouth daily. Patient taking  differently: Take 1 mg by mouth daily. 11/22/23   Singh, Prashant K, MD  lansoprazole (PREVACID) 30 MG capsule TAKE ONE CAPSULE BY MOUTH EVERY DAY Patient taking differently: Take 30 mg by mouth daily. 07/04/11   Jakie Alm SAUNDERS, MD  oxyCODONE  (ROXICODONE ) 5 MG immediate release tablet Take 1 tablet (5 mg total) by mouth every 6 (six) hours as needed for severe pain (pain score 7-10). 02/24/24   Dillard, Naima, MD    Allergies: Patient has no known allergies.    Review of Systems  Constitutional:  Negative for chills and fever.  HENT:  Negative for ear pain and sore throat.   Eyes:  Negative for pain and visual disturbance.  Respiratory:  Negative for cough and shortness of breath.   Cardiovascular:  Negative for chest pain and palpitations.  Gastrointestinal:  Negative for abdominal pain and vomiting.  Genitourinary:  Positive for vaginal bleeding. Negative for dysuria and hematuria.  Musculoskeletal:  Negative for arthralgias and back pain.  Skin:  Negative for color change and rash.  Neurological:  Negative for seizures and syncope.  All other systems reviewed and are negative.   Updated Vital Signs BP (!) 148/111 (BP Location: Right Arm)   Pulse (!) 104   Temp 98.1 F (36.7 C)   Resp 17   Ht 6' 2 (  1.88 m)   Wt (!) 138.3 kg   LMP 01/09/2024   SpO2 99%   BMI 39.16 kg/m   Physical Exam Vitals and nursing note reviewed.  Constitutional:      General: She is not in acute distress.    Appearance: Normal appearance. She is well-developed. She is not ill-appearing.  HENT:     Head: Normocephalic and atraumatic.  Eyes:     Conjunctiva/sclera: Conjunctivae normal.  Cardiovascular:     Rate and Rhythm: Normal rate and regular rhythm.     Heart sounds: No murmur heard. Pulmonary:     Effort: Pulmonary effort is normal. No respiratory distress.     Breath sounds: Normal breath sounds.  Abdominal:     Palpations: Abdomen is soft.     Tenderness: There is no abdominal  tenderness.  Genitourinary:    Comments: Recommended pelvic exam but patient declined states her bleeding has improved while she has been in the ER.  She deferred the exam. Musculoskeletal:        General: No swelling.     Cervical back: Neck supple.  Skin:    General: Skin is warm and dry.     Capillary Refill: Capillary refill takes less than 2 seconds.  Neurological:     Mental Status: She is alert.  Psychiatric:        Mood and Affect: Mood normal.     (all labs ordered are listed, but only abnormal results are displayed) Labs Reviewed  CBC WITH DIFFERENTIAL/PLATELET - Abnormal; Notable for the following components:      Result Value   Hemoglobin 10.1 (*)    HCT 33.4 (*)    MCH 25.2 (*)    RDW 16.8 (*)    All other components within normal limits  BASIC METABOLIC PANEL WITH GFR - Abnormal; Notable for the following components:   Sodium 134 (*)    CO2 20 (*)    Glucose, Bld 104 (*)    Calcium 8.5 (*)    All other components within normal limits  URINALYSIS, ROUTINE W REFLEX MICROSCOPIC - Abnormal; Notable for the following components:   Color, Urine RED (*)    APPearance TURBID (*)    Glucose, UA   (*)    Value: TEST NOT REPORTED DUE TO COLOR INTERFERENCE OF URINE PIGMENT   Hgb urine dipstick   (*)    Value: TEST NOT REPORTED DUE TO COLOR INTERFERENCE OF URINE PIGMENT   Bilirubin Urine   (*)    Value: TEST NOT REPORTED DUE TO COLOR INTERFERENCE OF URINE PIGMENT   Ketones, ur   (*)    Value: TEST NOT REPORTED DUE TO COLOR INTERFERENCE OF URINE PIGMENT   Protein, ur   (*)    Value: TEST NOT REPORTED DUE TO COLOR INTERFERENCE OF URINE PIGMENT   Leukocytes,Ua   (*)    Value: TEST NOT REPORTED DUE TO COLOR INTERFERENCE OF URINE PIGMENT   All other components within normal limits  URINALYSIS, MICROSCOPIC (REFLEX) - Abnormal; Notable for the following components:   Bacteria, UA RARE (*)    All other components within normal limits  HCG, SERUM, QUALITATIVE     EKG: None  Radiology: No results found.   Procedures   Medications Ordered in the ED - No data to display  Medical Decision Making Patient here for ongoing vaginal bleeding.  Hemoglobin is stable and vitals are stable.  She is not having any concerning symptoms of anemia at this time.  I recommended a pelvic exam and started OB/GYN but patient deferred and feels like her bleeding has somewhat improved since she has been in the ER.  She is aware of the risks of not having a pelvic exam.  She has a scheduled follow-up appoint with OB/GYN tomorrow morning at 10 AM.  After discussion with Dr. Waddell, patient's OB/GYN I think it is reasonable for her to go home and follow-up.  Patient is advised to return to the ER for any new or worsening symptoms.  She feels comfortable being discharged home.  Problems Addressed: Vaginal bleeding: undiagnosed new problem with uncertain prognosis  Amount and/or Complexity of Data Reviewed External Data Reviewed: notes.    Details: Prior outpatient records reviewed patient had an endometrial ablation about 6 weeks ago by Dr. Armond Labs: ordered. Decision-making details documented in ED Course.    Details: Ordered and reviewed by me and hemoglobin is stable, it is low but patient seems to have been 10 for the last month Discussion of management or test interpretation with external provider(s): Dr. Dillard-OB/GYN-I spoke with her regarding the patient's case and she will plan to see the patient in the office tomorrow at 10 AM and thinks the patient is stable for discharge at this time.  We did discuss TXA as well but decided due to patient's history of PE and the fact that she is on Eliquis  we will defer this at this time  Risk OTC drugs. Prescription drug management.     Final diagnoses:  Vaginal bleeding    ED Discharge Orders     None          Gennaro Duwaine CROME, DO 04/10/24 1237

## 2024-04-10 NOTE — Progress Notes (Signed)
 Called by Dr Gennaro about transferring patient to MAU Relevant patient information- s/p endometrial ablation 8/15 with [facility] Dr. Armond. Is on Elipquis for h/o blood clots. Patient also has an appt at [facility] tomorro.   Patient has a stable HGB which I personally reviewed in EPIC.  Lab Results  Component Value Date   HGB 10.1 (L) 04/10/2024   HGB 10.6 (L) 02/24/2024   I reviewed case with Dr. Bebe Furry, [facility] On Call GYN  Recommended Dr. Gennaro call on-call for [facility] to ask about management in ER vs outpatient.  Do not feel transfer to MAU is necessary at this time.  Encouraged ER to call if transfer becomes more necessary  Suzen Maryan Masters, MD, MPH, ABFM, [facility] Attending Physician Center for [facility]

## 2024-04-10 NOTE — ED Triage Notes (Signed)
 Pt. Stated, Ive had vaginal bleeding for 5 days . I had a endometrial ablation 6 weeks ago, so I thinks its fibroids. I had blood clots in my lungs in May so had to go off the birth control. Im on blood thinners.

## 2024-04-12 ENCOUNTER — Other Ambulatory Visit: Payer: Self-pay

## 2024-04-12 ENCOUNTER — Encounter (HOSPITAL_COMMUNITY): Payer: Self-pay

## 2024-04-12 ENCOUNTER — Emergency Department (HOSPITAL_COMMUNITY): Admission: EM | Admit: 2024-04-12 | Discharge: 2024-04-12 | Disposition: A | Discharge status: Home / Self Care

## 2024-04-12 DIAGNOSIS — N939 Abnormal uterine and vaginal bleeding, unspecified: Secondary | ICD-10-CM | POA: Diagnosis present

## 2024-04-12 DIAGNOSIS — Z7901 Long term (current) use of anticoagulants: Secondary | ICD-10-CM | POA: Diagnosis not present

## 2024-04-12 DIAGNOSIS — D649 Anemia, unspecified: Secondary | ICD-10-CM | POA: Insufficient documentation

## 2024-04-12 LAB — CBC WITH DIFFERENTIAL/PLATELET
Abs Immature Granulocytes: 0.04 K/uL (ref 0.00–0.07)
Basophils Absolute: 0 K/uL (ref 0.0–0.1)
Basophils Relative: 0 %
Eosinophils Absolute: 0 K/uL (ref 0.0–0.5)
Eosinophils Relative: 0 %
HCT: 27.1 % — ABNORMAL LOW (ref 36.0–46.0)
Hemoglobin: 8.2 g/dL — ABNORMAL LOW (ref 12.0–15.0)
Immature Granulocytes: 1 %
Lymphocytes Relative: 29 %
Lymphs Abs: 2.2 K/uL (ref 0.7–4.0)
MCH: 25.2 pg — ABNORMAL LOW (ref 26.0–34.0)
MCHC: 30.3 g/dL (ref 30.0–36.0)
MCV: 83.1 fL (ref 80.0–100.0)
Monocytes Absolute: 0.4 K/uL (ref 0.1–1.0)
Monocytes Relative: 5 %
Neutro Abs: 4.9 K/uL (ref 1.7–7.7)
Neutrophils Relative %: 65 %
Platelets: 342 K/uL (ref 150–400)
RBC: 3.26 MIL/uL — ABNORMAL LOW (ref 3.87–5.11)
RDW: 16.9 % — ABNORMAL HIGH (ref 11.5–15.5)
WBC: 7.5 K/uL (ref 4.0–10.5)
nRBC: 0 % (ref 0.0–0.2)

## 2024-04-12 LAB — COMPREHENSIVE METABOLIC PANEL WITH GFR
ALT: 11 U/L (ref 0–44)
AST: 15 U/L (ref 15–41)
Albumin: 3.3 g/dL — ABNORMAL LOW (ref 3.5–5.0)
Alkaline Phosphatase: 67 U/L (ref 38–126)
Anion gap: 7 (ref 5–15)
BUN: 11 mg/dL (ref 6–20)
CO2: 22 mmol/L (ref 22–32)
Calcium: 8.7 mg/dL — ABNORMAL LOW (ref 8.9–10.3)
Chloride: 107 mmol/L (ref 98–111)
Creatinine, Ser: 0.71 mg/dL (ref 0.44–1.00)
GFR, Estimated: >60 mL/min (ref >60)
Glucose, Bld: 113 mg/dL — ABNORMAL HIGH (ref 70–99)
Potassium: 4.1 mmol/L (ref 3.5–5.1)
Sodium: 136 mmol/L (ref 135–145)
Total Bilirubin: 0.4 mg/dL (ref 0.0–1.2)
Total Protein: 6.1 g/dL — ABNORMAL LOW (ref 6.5–8.1)

## 2024-04-12 LAB — PREPARE RBC (CROSSMATCH)

## 2024-04-12 LAB — HCG, SERUM, QUALITATIVE: Preg, Serum: NEGATIVE

## 2024-04-12 LAB — CBG MONITORING, ED: Glucose-Capillary: 109 mg/dL — ABNORMAL HIGH (ref 70–99)

## 2024-04-12 MED ORDER — SODIUM CHLORIDE 0.9% IV SOLUTION: Freq: Once | INTRAVENOUS | Status: AC

## 2024-04-12 MED ORDER — SODIUM CHLORIDE 0.9 % IV BOLUS
1000.0000 mL | Freq: Once | INTRAVENOUS | Status: AC
  Administered 2024-04-12: 1000 mL via INTRAVENOUS

## 2024-04-12 NOTE — ED Triage Notes (Signed)
 Pt c.o feeling dizzy, lightheaded and tachycardic. Pt was seen here on Tuesday for similar. States she had an ablation 6 weeks ago and I still having a lot of heavy bleeding. Had an appointment with her OBGYN at 10 today but decided to come here

## 2024-04-12 NOTE — ED Notes (Signed)
 This paramedic sat with patient for the first 15 minutes of blood transfusion with no incident.

## 2024-04-12 NOTE — ED Notes (Signed)
 Pt called RN to room, requesting to leave prior to d/c paperwork as she has to get home to her kids. IV removed. Pt not agreeable to d/c vitals at this time. Pt ambulatory out of ED w/ all belongings

## 2024-04-12 NOTE — ED Provider Notes (Signed)
 [place] EMERGENCY DEPARTMENT AT [facility] Provider Note   CSN: [id number] Arrival date & time: 04/12/24  9051     Patient presents with: Dizziness and Vaginal Bleeding   Tiffany DOXTATER is a 38 y.o. female.    Dizziness Vaginal Bleeding Associated symptoms: dizziness       Patient presents because of ongoing vaginal bleeding.  Patient states that she was changing tampons about once every hour.  Still spaced out to about once every 1.5 hours.  Patient states that she woke up today is feeling very fatigued.  Felt somewhat lightheaded.  For, decided come to ED for further evaluation.  No chest pain or shortness of breath.  Just feels very fatigued.  No nausea vomiting or diarrhea.  She was supposed to follow-up with gynecology today but ended up come to the ED for further evaluation.  Previous medical history reviewed : Patient was last seen in the ED on April 10, 2024.  Was seen because of vaginal bleeding.  Hemoglobin was stable and vital signs stable.   Prior to Admission medications   Medication Sig Start Date End Date Taking? Authorizing Provider  acetaminophen  (TYLENOL ) 500 MG tablet Take 1,000 mg by mouth 2 (two) times daily as needed for headache, fever or moderate pain (pain score 4-6).   Yes [provider]  apixaban  (ELIQUIS ) 5 MG TABS tablet Take 1 tablet (5 mg total) by mouth 2 (two) times daily. 02/28/24  Yes Booker Darice SAUNDERS, FNP  buPROPion  (WELLBUTRIN ) 100 MG tablet Take 100 mg by mouth daily at 12 noon. 10/31/23  Yes [provider]  busPIRone (BUSPAR) 10 MG tablet Take 10 mg by mouth daily at 12 noon.   Yes [provider]  clonazePAM (KLONOPIN) 0.5 MG tablet Take 0.5 mg by mouth daily as needed (severe anxiety).   Yes [provider]  cyanocobalamin  1000 MCG tablet Take 1 tablet (1,000 mcg total) by mouth daily. Patient taking differently: Take 1,000 mcg by mouth daily after lunch. 11/23/23  Yes Singh, Prashant K, MD   escitalopram  (LEXAPRO ) 20 MG tablet Take 20 mg by mouth daily at 12 noon.   Yes [provider]  ferrous sulfate  325 (65 FE) MG tablet Take 1 tablet (325 mg total) by mouth 2 (two) times daily with a meal. Patient taking differently: Take 325 mg by mouth daily after lunch. 11/21/23  Yes Dennise Lavada POUR, MD  folic acid  (FOLVITE ) 1 MG tablet Take 1 tablet (1 mg total) by mouth daily. Patient taking differently: Take 1 mg by mouth daily after lunch. 11/22/23  Yes Singh, Prashant K, MD  Lansoprazole (PREVACID 24HR PO) Take 30 mg by mouth daily. OTC   Yes [provider]    Allergies: Lo loestrin fe [norethin ace-eth estrad-fe]    Review of Systems  Genitourinary:  Positive for vaginal bleeding.  Neurological:  Positive for dizziness.    Updated Vital Signs BP 129/62 (BP Location: Left Arm)   Pulse 94   Temp 97.8 F (36.6 C) (Oral)   Resp 16   Ht 6' 2 (1.88 m)   Wt (!) 138.3 kg   LMP 01/09/2024   SpO2 100%   BMI 39.15 kg/m   Physical Exam Vitals and nursing note reviewed.  Constitutional:      General: She is not in acute distress.    Appearance: She is well-developed.  HENT:     Head: Normocephalic and atraumatic.  Eyes:     Conjunctiva/sclera: Conjunctivae normal.  Cardiovascular:     Rate and Rhythm: Normal rate and regular rhythm.     Heart sounds: No murmur heard. Pulmonary:     Effort: Pulmonary effort is normal. No respiratory distress.     Breath sounds: Normal breath sounds.  Abdominal:     Palpations: Abdomen is soft.     Tenderness: There is no abdominal tenderness.  Genitourinary:    Comments: GU exam with chaperone paramedic Chaplin   Blood in vaginal vault. No excessive bleeding through the cervical os.  Musculoskeletal:        General: No swelling.     Cervical back: Neck supple.  Skin:    General: Skin is warm and dry.     Capillary Refill: Capillary refill takes less than 2 seconds.  Neurological:     Mental Status: She is  alert.  Psychiatric:        Mood and Affect: Mood normal.     (all labs ordered are listed, but only abnormal results are displayed) Labs Reviewed  CBC WITH DIFFERENTIAL/PLATELET - Abnormal; Notable for the following components:      Result Value   RBC 3.26 (*)    Hemoglobin 8.2 (*)    HCT 27.1 (*)    MCH 25.2 (*)    RDW 16.9 (*)    All other components within normal limits  COMPREHENSIVE METABOLIC PANEL WITH GFR - Abnormal; Notable for the following components:   Glucose, Bld 113 (*)    Calcium 8.7 (*)    Total Protein 6.1 (*)    Albumin 3.3 (*)    All other components within normal limits  CBG MONITORING, ED - Abnormal; Notable for the following components:   Glucose-Capillary 109 (*)    All other components within normal limits  HCG, SERUM, QUALITATIVE  TYPE AND SCREEN  PREPARE RBC (CROSSMATCH)    EKG: EKG Interpretation Date/Time:  Thursday April 12 2024 10:47:25 EDT Ventricular Rate:  95 PR Interval:  143 QRS Duration:  96 QT Interval:  340 QTC Calculation: 428 R Axis:   31  Text Interpretation: Sinus rhythm Atrial premature complex Confirmed by Simon Rea [phone]) on 04/12/2024 10:50:44 AM  Radiology: No results found.   Procedures   Medications Ordered in the ED  sodium chloride  0.9 % bolus 1,000 mL (0 mLs Intravenous Stopped 04/12/24 1405)  0.9 %  sodium chloride  infusion (Manually program via Guardrails IV Fluids) ( Intravenous New Bag/Given 04/12/24 1415)    Clinical Course as of 04/12/24 1528  Thu Apr 12, 2024  1120 Dr. Armond : TXA. See her on Monday. Wear support hose.   TXA: 1300 PO. Startd 650 PO TID. Can increase to 1300 for 5 days. Will see on Monday.  [TL]    Clinical Course User Index [TL] Simon Rea SAILOR, MD                                 Medical Decision Making Amount and/or Complexity of Data Reviewed Labs: ordered.  Risk Prescription drug management.    Patient presents because of ongoing vaginal bleeding.  Patient  states that she was changing tampons about once every hour.  Still spaced out to about once every 1.5 hours.  Patient states that she woke up today is feeling very fatigued.  Felt somewhat lightheaded.  For, decided come to ED for further evaluation.  No chest pain or shortness of breath.  Just feels very fatigued.  No nausea vomiting or diarrhea.  She was supposed to follow-up with gynecology today but ended up come to the ED for further evaluation.  Previous medical history reviewed : Patient was last seen in the ED on April 10, 2024.  Was seen because of vaginal bleeding.  Hemoglobin was stable and vital signs stable.   GU exam with chaperone paramedic Chaplin: Blood in vaginal vault. No excessive bleeding through the cervical os.   Otherwise, benign abdomen.  No rebound or guarding.  Hemodynamically stable.  Hemoglobin did drop to 8.2 over the past 2 days.  Patient feels like the bleeding has subsequently slowed a little bit here recently.  As noted above, my exam, no excessive bleeding through the cervical os.  I did speak to her gynecologist, Dr. Armond.  We discussed possible TXA given patient's ongoing vaginal bleeding.  Unfortunately, she is high risk for complications of TXA given history of DVT or PE.  Especially given the recent PE with right heart strain.  Therefore, feel like she is not a good candidate for TXA.  We discussed her case further.  Will plan to give her 1 unit packed red blood cells here in the ED and have strict follow-up on Monday.  This is reasonable given patient's decreased bleeding as well as stable vital signs.  Will plan to transfuse 1 unit of packed red blood cells and then patient will follow-up.  Will likely need hysterectomy.   No concerns in, treatment failure in terms of her pulmonary embolism.  Patient has some slight tachycardia when she first arrived but this was after she walked into the ED.   Sitting at rest, heart rate in the 80s.  O2 saturation are  percent on room air.  No tachypnea.  No clear chest pain or hemoptysis.  Patient feels completely fine from a cardiopulmonary standpoint.  No concerns of treatment failure.  No indication for imaging.    Patient signed out pending completion of transfusion.    Final diagnoses:  Vaginal bleeding  Anemia, unspecified type    ED Discharge Orders     None          Simon Lavonia SAILOR, MD 04/12/24 1528

## 2024-04-12 NOTE — ED Notes (Signed)
 Care of patient assumed from Krista C. Pt resting comfortably on stretcher, denies pain, VSS, mother at bedside. Pt updated on POC, call bell in reach.

## 2024-04-12 NOTE — ED Provider Notes (Signed)
  Physical Exam  BP 109/83   Pulse 84   Temp 97.9 F (36.6 C) (Oral)   Resp 14   Ht 6' 2 (1.88 m)   Wt (!) 138.3 kg   LMP 01/09/2024   SpO2 100%   BMI 39.15 kg/m   Physical Exam Vitals and nursing note reviewed.  HENT:     Head: Normocephalic and atraumatic.  Eyes:     Pupils: Pupils are equal, round, and reactive to light.  Cardiovascular:     Rate and Rhythm: Normal rate and regular rhythm.  Pulmonary:     Effort: Pulmonary effort is normal.     Breath sounds: Normal breath sounds.  Abdominal:     Palpations: Abdomen is soft.     Tenderness: There is no abdominal tenderness.  Skin:    General: Skin is warm and dry.  Neurological:     Mental Status: She is alert.  Psychiatric:        Mood and Affect: Mood normal.     Procedures  Procedures  ED Course / MDM   Clinical Course as of 04/12/24 1827  Thu Apr 12, 2024  1120 Dr. Armond : TXA. See her on Monday. Wear support hose.   TXA: 1300 PO. Startd 650 PO TID. Can increase to 1300 for 5 days. Will see on Monday.  [TL]  1826 Transfusion completed.  Patient feeling little better.  She will follow-up with her OB/GYN on Monday.  Return precautions discussed with her and her mother in detail. [MP]    Clinical Course User Index [MP] Pamella Ozell LABOR, DO [TL] Simon Lavonia SAILOR, MD   Medical Decision Making I, Ozell Pamella DO, have assumed care of this patient from the previous provider pending blood transfusion and discharge  Amount and/or Complexity of Data Reviewed Labs: ordered.  Risk Prescription drug management.          Pamella Ozell LABOR, DO 04/12/24 1827

## 2024-04-12 NOTE — Discharge Instructions (Addendum)
 Please follow up with Dr. Armond on Monday.  Please give her office a call to establish what time you need to be there on Monday.  If you have any kind of excessive bleeding over the weekend then please come back to the ED for further evaluation.  We gave you 1 unit of packed red blood cells here.  My GU exam showed no significant bleeding from the cervical os which is reassuring.  If you start having increasing bleeding then please come back.

## 2024-04-13 LAB — BPAM RBC
Blood Product Expiration Date: 202510292359
ISSUE DATE / TIME: 202510021403
Unit Type and Rh: 6200

## 2024-04-13 LAB — TYPE AND SCREEN
ABO/RH(D): A POS
Antibody Screen: NEGATIVE
Unit division: 0

## 2024-04-17 ENCOUNTER — Telehealth: Payer: Self-pay | Admitting: Primary Care

## 2024-04-17 NOTE — Telephone Encounter (Signed)
 Please let patient know ONO on room air showed patient spent <1 minute with O2 level below 88%. SpO2 low 86 with baseline 95%. ODI was 5.6 which is relatively normal. No additional sleep study needed unless she is having worsening sleep disruptions or waking up gasping/choking.

## 2024-04-18 NOTE — Telephone Encounter (Signed)
 Atcx1.sent mychart msg

## 2024-04-19 ENCOUNTER — Telehealth: Payer: Self-pay

## 2024-04-19 NOTE — Telephone Encounter (Signed)
 Copied from CRM #[id number]. Topic: Clinical - Lab/Test Results >> Apr 18, 2024  2:33 PM Leila C wrote: Reason for CRM: Patient [phone] is returning the office/Atalia Litzinger's call on sleep study result. CAL is unavailabe. Please call back.  Has been handled sent via mychart,called patient no additional questions

## 2024-06-29 ENCOUNTER — Ambulatory Visit: Admitting: Primary Care

## 2024-07-02 ENCOUNTER — Other Ambulatory Visit: Payer: Self-pay | Admitting: Obstetrics and Gynecology

## 2024-07-13 ENCOUNTER — Other Ambulatory Visit (HOSPITAL_COMMUNITY): Payer: Self-pay | Admitting: Obstetrics and Gynecology

## 2024-07-13 ENCOUNTER — Other Ambulatory Visit: Payer: Self-pay | Admitting: Obstetrics and Gynecology

## 2024-07-13 ENCOUNTER — Telehealth (HOSPITAL_COMMUNITY): Payer: Self-pay | Admitting: Pharmacy Technician

## 2024-07-13 NOTE — Telephone Encounter (Signed)
 Auth Submission: NO AUTH NEEDED Site of care: [facility] Payer: [employer] Medication & CPT/J Code(s) submitted: Feraheme (ferumoxytol) [id number] Diagnosis Code: D50.9 Route of submission (phone, fax, portal): phone Phone # Fax # Auth type: Buy/Bill HB Units/visits requested: 510mg  x 2 doses Reference number: [id number] Approval from: 07/13/2024 to 10/11/24    Dagoberto Armour, CPhT [facility] Phone: [phone] 07/13/2024

## 2024-07-16 ENCOUNTER — Encounter (HOSPITAL_COMMUNITY): Payer: Self-pay | Admitting: Obstetrics and Gynecology

## 2024-07-19 ENCOUNTER — Encounter: Admitting: Family Medicine

## 2024-07-19 ENCOUNTER — Telehealth: Payer: Self-pay | Admitting: Internal Medicine

## 2024-07-19 NOTE — Telephone Encounter (Signed)
 Fax received from Dr. Armond with [facility] to perform a laparoscopic assisted vaginal hysterectomy with possible abd hysterectomy  on patient.  Patient needs surgery clearance. Surgery is 08/07/23. Patient was seen on 01/31/24. Office protocol is a risk assessment can be sent to surgeon if patient has been seen in 60 days or less.    She is scheduled for her next appt on 08/01/24 with Beth. I added to her appt notes she needs risk assessment and will route back to the clearance pool for now.

## 2024-07-21 ENCOUNTER — Encounter (HOSPITAL_COMMUNITY): Payer: Self-pay | Admitting: Obstetrics and Gynecology

## 2024-07-24 ENCOUNTER — Encounter (HOSPITAL_COMMUNITY)

## 2024-07-24 ENCOUNTER — Encounter (HOSPITAL_COMMUNITY): Payer: Self-pay | Admitting: Obstetrics and Gynecology

## 2024-07-24 NOTE — Telephone Encounter (Signed)
 Per patient, she is wanting to hold off on iron infusions until after procedure. Message sent to ordering provider. Awaiting response.

## 2024-07-25 ENCOUNTER — Telehealth: Payer: Self-pay | Admitting: *Deleted

## 2024-07-25 NOTE — Telephone Encounter (Signed)
 Copied from CRM [id number]. Topic: Medical Record Request - Other >> Jul 13, 2024 10:54 AM Devaughn RAMAN wrote: Reason for CRM: Cheryl with [facility] called in regarding a surgical clearance she faxed over on 12.19.25 for pt's eliquis  and she was calling to f/u.  [facility] Phone-[phone] Fax-[phone]  We are seeing the pt for risk assessment on 08/01/24  I called and spoke with Cheryl and provided her with this update  Nothing further needed at this time

## 2024-07-25 NOTE — Telephone Encounter (Signed)
 Copied from CRM #[id number]. Topic: General - Other >> Jul 18, 2024 11:58 AM Benton O wrote: Reason for CRM: ms del is calling because she spoke to kim jan 2nd about meedical clearance . had to refax letter had to clear it up . wanting to know do they have the letter complete cause she has surgery on the 26th  Duplicate

## 2024-07-31 ENCOUNTER — Encounter (HOSPITAL_COMMUNITY)

## 2024-07-31 ENCOUNTER — Telehealth: Payer: Self-pay

## 2024-07-31 NOTE — Telephone Encounter (Signed)
 [facility], per the lov note, pt was supposed to completed an ONO but it looks like it is still under needs scheduling. Do we know why this wasn't completed or scheduled sooner?

## 2024-08-01 ENCOUNTER — Encounter: Payer: Self-pay | Admitting: Primary Care

## 2024-08-01 ENCOUNTER — Ambulatory Visit: Admitting: Primary Care

## 2024-08-01 VITALS — BP 126/72 | HR 97 | Temp 97.5°F | Ht 74.0 in | Wt 303.8 lb

## 2024-08-01 DIAGNOSIS — Z01811 Encounter for preprocedural respiratory examination: Secondary | ICD-10-CM | POA: Diagnosis not present

## 2024-08-01 DIAGNOSIS — Z87891 Personal history of nicotine dependence: Secondary | ICD-10-CM | POA: Diagnosis not present

## 2024-08-01 DIAGNOSIS — Z86711 Personal history of pulmonary embolism: Secondary | ICD-10-CM

## 2024-08-01 NOTE — Progress Notes (Signed)
 "  @Patient  ID: Tiffany English, female    DOB: 12-May-1986, 39 y.o.   MRN: [id number]  Chief Complaint  Patient presents with   Medical Management of Chronic Issues    Hysterectomy- [facility] with Dr Ovid All     Referring provider: Booker Darice SAUNDERS, FNP  HPI: 39 year old female, former smoker quit in 2020. PMH significant for acute pulmonary embolism, IBS, GERD, obesity.   Previous [facility] pulmonary encounter:  01/31/2024 Discussed the use of AI scribe software for clinical note transcription with the patient, who gave verbal consent to proceed.  History of Present Illness   Tiffany English is a 39 year old female with a history of pulmonary embolism who presents for a follow-up visit.  In May, she was admitted for a massive pulmonary embolism.  Pulmonary embonism felt to be provoked due to oral contraceptive use. She is currently on Eliquis  5mg  twice daily for maintenance. She saw hematology/oncology on 6/25, they recommended patient complete 6 months of anticoagulation.  She has no respiratory symptoms, shortness of breath, or chest pain, and her oxygen saturation is consistently between 98 and 100%. She has not needed supplemental oxygen for several months.  She was previously on oral contraceptives for prolonged menstrual bleeding lasting four months, which were identified as a potential cause for the clot. She has discontinued the contraceptives and is scheduled for a gynecological procedure on August 15th to address the menstrual issues. Her iron levels are low, and she is taking iron supplements, B12, and folic acid .  During her hospital stay, a drop in oxygen levels prompted a recommendation for a sleep study. She has no symptoms of sleep apnea, such as snoring or waking up gasping. She occasionally wakes at night due to her animals but does not feel her sleep is significantly disturbed. Anxiety affects her sleep, but she feels rested with adequate sleep.  She works in [employer] at [employer] and has been working extensively.    08/01/2024 Discussed the use of AI scribe software for clinical note transcription with the patient, who gave verbal consent to proceed.  History of Present Illness Tiffany English is a 39 year old female who presents for a preoperative risk assessment for hysterectomy.  She is scheduled for a hysterectomy due to prolonged and heavy vaginal bleeding, resulting in significant anemia. Her periods have lasted over five weeks, with the most recent one ending two weeks ago. She experiences fatigue due to the bleeding and anemia. A blood transfusion was required in October due to severe bleeding, and she visited the emergency room twice at the end of September and early October for this issue.  She has a history of a provoked pulmonary embolism in May 2025, secondary to oral contraceptive use, and has been on anticoagulation therapy with Eliquis  for over eight months. Her hematology team recommended continuing anticoagulation until her iron levels improve, as she is anemic.  Her past medical history includes microcytic anemia, hypokalemia, and obesity. She has been taking iron supplements to address her anemia. She was a vegetarian for over ten years, which may have contributed to her low iron levels. Her ferritin levels were low eight months ago at 8, and increased to 21 seven months ago. Her hemoglobin was 8.2 in October.  No history of mechanical heart valves, cardiac arrhythmias, or recent deep vein thrombosis. No recent respiratory symptoms, shortness of breath, chest pain, or cough. An overnight oximetry test showed that her oxygen level dropped to  86% once, but she was told she would not need any additional treatment. No sleep disruptions such as waking up gasping or choking.  Her social history includes a past history of light smoking for less than two years, which she quit before the COVID-19 pandemic. She worked at [employer],  where smoking was prevalent.  Hx provoked pulmonary embolism in May 2025 secondary to oral contraceptive use  Patient following with hematology/oncology who recommended 6 months of anticoagulation Patient has been on treatment for >8 months Stable on anticoagulation No hx thromophilia or mechanical heart valve  No recent DVT   Hold Eliquis  48 hours prior, high bleeding risk  Restart Eliquis  47-72 hours post op   Allergies[1]   There is no immunization history on file for this patient.  Past Medical History:  Diagnosis Date   Abnormal uterine bleeding (AUB)    Anticoagulated    eliquis --- started 05/ 2025 for PE/ DVT ;  managed by hematology/ oncology,  6 month therapy   Anxiety    B12 deficiency    Depression    Folic acid  deficiency    Gallbladder polyp    GERD (gastroesophageal reflux disease)    History of pulmonary embolus (PE) 11/18/2023   (02-20-2024 pt stated never had clots prior )  ED/ admission;  CP/ SOB  dx acute bilateral submassive PE/ right LLE DVTs (popliteal/ posterior tibial/ peroneal veins);   likely provoked on OCP, d/cd on eliquis --- managed by oncology/ hematology and follow-up w/ pulmonary   IBS (irritable bowel syndrome)    Iron deficiency anemia due to chronic blood loss    hematology--- lacie burton NP   Irregular menses    Obesity     Tobacco History: Tobacco Use History[2] Counseling given: Not Answered Tobacco comments: 02-20-2024  pt stated quit smoking 2020,  started 2019   (   Outpatient Medications Prior to Visit  Medication Sig Dispense Refill   acetaminophen  (TYLENOL ) 500 MG tablet Take 1,000 mg by mouth 2 (two) times daily as needed for headache, fever or moderate pain (pain score 4-6).     apixaban  (ELIQUIS ) 5 MG TABS tablet Take 1 tablet (5 mg total) by mouth 2 (two) times daily. 180 tablet 1   buPROPion  (WELLBUTRIN ) 100 MG tablet Take 100 mg by mouth daily at 12 noon.     busPIRone (BUSPAR) 10 MG tablet Take 10 mg by mouth daily  at 12 noon.     clonazePAM (KLONOPIN) 0.5 MG tablet Take 0.5 mg by mouth daily as needed (severe anxiety).     cyanocobalamin  1000 MCG tablet Take 1 tablet (1,000 mcg total) by mouth daily. (Patient taking differently: Take 1,000 mcg by mouth daily after lunch.) 30 tablet 0   escitalopram  (LEXAPRO ) 20 MG tablet Take 20 mg by mouth daily at 12 noon.     ferrous sulfate  325 (65 FE) MG tablet Take 1 tablet (325 mg total) by mouth 2 (two) times daily with a meal. (Patient taking differently: Take 325 mg by mouth daily after lunch.) 60 tablet 0   folic acid  (FOLVITE ) 1 MG tablet Take 1 tablet (1 mg total) by mouth daily. (Patient taking differently: Take 1 mg by mouth daily after lunch.) 30 tablet 0   Lansoprazole (PREVACID 24HR PO) Take 30 mg by mouth daily. OTC     No facility-administered medications prior to visit.      Review of Systems  Review of Systems  Constitutional: Negative.   Respiratory: Negative.     Physical  Exam  BP 126/72   Pulse 97   Temp (!) 97.5 F (36.4 C)   Ht 6' 2 (1.88 m) Comment: pt stated  Wt (!) 303 lb 12.8 oz (137.8 kg)   SpO2 99% Comment: ra  BMI 39.01 kg/m  Physical Exam Constitutional:      Appearance: Normal appearance. She is well-developed.  HENT:     Head: Normocephalic and atraumatic.     Mouth/Throat:     Mouth: Mucous membranes are moist.     Pharynx: Oropharynx is clear.  Eyes:     Pupils: Pupils are equal, round, and reactive to light.  Cardiovascular:     Rate and Rhythm: Normal rate and regular rhythm.     Heart sounds: Normal heart sounds. No murmur heard. Pulmonary:     Effort: Pulmonary effort is normal. No respiratory distress.     Breath sounds: Normal breath sounds. No wheezing or rhonchi.  Musculoskeletal:        General: Normal range of motion.     Cervical back: Normal range of motion and neck supple.  Skin:    General: Skin is warm and dry.     Findings: No erythema or rash.  Neurological:     General: No focal  deficit present.     Mental Status: She is alert and oriented to person, place, and time. Mental status is at baseline.  Psychiatric:        Mood and Affect: Mood normal.        Behavior: Behavior normal.        Thought Content: Thought content normal.        Judgment: Judgment normal.      Lab Results:  CBC    Component Value Date/Time   WBC 7.5 04/12/2024 1009   RBC 3.26 (L) 04/12/2024 1009   HGB 8.2 (L) 04/12/2024 1009   HGB 12.5 01/04/2024 0945   HGB 11.0 (L) 11/28/2023 0952   HCT 27.1 (L) 04/12/2024 1009   HCT 38.6 11/28/2023 0952   PLT 342 04/12/2024 1009   PLT 333 01/04/2024 0945   PLT 585 (H) 11/28/2023 0952   MCV 83.1 04/12/2024 1009   MCV 74 (L) 11/28/2023 0952   MCH 25.2 (L) 04/12/2024 1009   MCHC 30.3 04/12/2024 1009   RDW 16.9 (H) 04/12/2024 1009   RDW 16.3 (H) 11/28/2023 0952   LYMPHSABS 2.2 04/12/2024 1009   MONOABS 0.4 04/12/2024 1009   EOSABS 0.0 04/12/2024 1009   BASOSABS 0.0 04/12/2024 1009    BMET    Component Value Date/Time   NA 136 04/12/2024 1009   NA 136 11/28/2023 0952   K 4.1 04/12/2024 1009   CL 107 04/12/2024 1009   CO2 22 04/12/2024 1009   GLUCOSE 113 (H) 04/12/2024 1009   BUN 11 04/12/2024 1009   BUN 9 11/28/2023 0952   CREATININE 0.71 04/12/2024 1009   CALCIUM 8.7 (L) 04/12/2024 1009   GFRNONAA >60 04/12/2024 1009   GFRAA 161 05/12/2007 0854    BNP    Component Value Date/Time   BNP 327.5 (H) 11/19/2023 2147    ProBNP No results found for: PROBNP  Imaging: No results found.   Assessment & Plan:   No problem-specific Assessment & Plan notes found for this encounter.   There are no diagnoses linked to this encounter.  Assessment and Plan Assessment & Plan Preoperative pulmonary risk assessment for hysterectomy  Undergoing preoperative pulmonary risk assessment for hysterectomy due to severe anemia and prolonged menstrual bleeding.  History of provoked pulmonary embolism in May 2025 secondary to oral  contraceptive use. Currently on anticoagulation therapy with Eliquis . She has been on treatment for over eight months. No recent DVTs or cardiac arrhythmias. No mechanical heart valve. Recent overnight oximetry test showed minimal oxygen desaturation, no need for supplemental oxygen or sleep study. Physical exam was benign today, VSS. Low risk for prolonged mechanical ventilator or post-op pulmonary complications. From a pulmonary standpoint patient is optimized for surgery from a pulmonary standpoint, ultimate clearance will be decided by surgeon.  - Hold Eliquis  48 hours prior to surgery. - Restart anticoagulation postoperatively as per surgeon's instructions - Ensure early ambulation postoperatively to reduce risk of blood clots and respiratory complications. - Use incentive spirometer every hour or at least three to four times a day postoperatively. - Wear compression stockings postoperatively to prevent blood clots.  History of pulmonary embolism Provoked pulmonary embolism in May 2025 secondary to oral contraceptive use. Currently well-managed on anticoagulation therapy with Eliquis  for over eight months. No recent DVTs or cardiac arrhythmias. No mechanical heart valve. No respiratory symptoms reported.  - Continue anticoagulation therapy as per hematology's recommendations until iron levels improve.    1) RISK FOR PROLONGED MECHANICAL VENTILAION - > 48h  1A) Arozullah - Prolonged mech ventilation risk Arozullah Postperative Pulmonary Risk Score - for mech ventilation dependence >48h Usaa, Ann Surg 2000, major non-cardiac surgery) Comment Score  Type of surgery - abd ao aneurysm (27), thoracic (21), neurosurgery / upper abdominal / vascular (21), neck (11) Hysterectomy  10  Emergency Surgery - (11)  0  ALbumin < 3 or poor nutritional state - (9)  0  BUN > 30 -  (8)  0  Partial or completely dependent functional status - (7)  0  COPD -  (6)  0  Age - 60 to 50 (4), > 70  (6)   0  TOTAL  10  Risk Stratifcation scores  - < 10 (0.5%), 11-19 (1.8%), 20-27 (4.2%), 28-40 (10.1%), >40 (26.6%)  0.5% risk prolonged mechanical ventilation       1B) GUPTA - Prolonged Mech Vent Risk Score source Risk  Guptal post op prolonged mech ventilation > 48h or reintubation < 30 days - ACS 2007-2008 dataset - [email or web address] 0.2 % Risk of mechanical ventilation for >48 hrs after surgery, or unplanned intubation <=30 days of surgery    2) RISK FOR POST OP PNEUMONIA Score source Risk  Charlanne - Post Op Pnemounia risk  [email or web address] 0.1 % Risk of postoperative pneumonia    R3) ISK FOR ANY POST-OP PULMONARY COMPLICATION Score source Risk  CANET/ARISCAT Score - risk for ANY/ALl pulmonary complications - > risk of in-hospital post-op pulmonary complications (composite including respiratory failure, respiratory infection, pleural effusion, atelectasis, pneumothorax, bronchospasm, aspiration pneumonitis) [email or web address] - based on age, anemia, pulse ox, resp infection prior 30d, incision site, duration of surgery, and emergency v elective surgery Low risk 1.6% risk of in-hospital post-op pulmonary complications (composite including respiratory failure, respiratory infection, pleural effusion, atelectasis, pneumothorax, bronchospasm, aspiration pneumonitis)    Almarie LELON Ferrari, NP 08/01/2024     [1]  Allergies Allergen Reactions   Lo Loestrin Fe [Norethin Ace-Eth Estrad-Fe] Other (See Comments)    PE secondary to blood clot  [2]  Social History Tobacco Use  Smoking Status Former   Types: Cigarettes  Smokeless Tobacco Not on file  Tobacco Comments   02-20-2024  pt stated quit smoking 2020,  started 2019   (   "

## 2024-08-01 NOTE — Patient Instructions (Addendum)
" °  VISIT SUMMARY: You had a preoperative risk assessment for your upcoming hysterectomy due to prolonged and heavy vaginal bleeding, which has caused significant anemia. We discussed your history of pulmonary embolism and your current anticoagulation therapy.  YOUR PLAN: -PREOPERATIVE PULMONARY RISK ASSESSMENT FOR HYSTERECTOMY: You are undergoing a preoperative pulmonary risk assessment for your hysterectomy due to severe anemia and prolonged menstrual bleeding. You have a history of a pulmonary embolism caused by oral contraceptive use, and you have been on anticoagulation therapy with Eliquis  for over eight months. Your recent overnight oximetry test showed minimal oxygen desaturation, and you are at low risk for prolonged mechanical ventilation and postoperative complications such as blood clots, pneumonia, and respiratory failure due to prolonged immobility. You should hold Eliquis  48 hours before surgery and restart anticoagulation postoperatively as per the surgeon's instructions. Ensure early ambulation, use an incentive spirometer every hour or at least three to four times a day, wear compression stockings, and monitor for signs of respiratory infection.  -HISTORY OF PULMONARY EMBOLISM: You had a pulmonary embolism in May 2025 caused by oral contraceptive use. You have been well-managed on anticoagulation therapy with Eliquis  for over eight months, with no recent deep vein thrombosis or cardiac arrhythmias. Continue your anticoagulation therapy as recommended by your hematology team until your iron levels improve.  INSTRUCTIONS: Hold Eliquis  48 hours before surgery. Restart anticoagulation postoperatively as per the surgeon's instructions. Ensure early ambulation postoperatively, use an incentive spirometer every hour or at least three to four times a day, wear compression stockings, and monitor for signs of respiratory infection. Postpone surgery if respiratory symptoms  develop.  Follow-up: Follow up with hematology for long term management of anticoagulation (Eliquis )    Contains text generated by [employer].   "

## 2024-08-02 ENCOUNTER — Encounter (HOSPITAL_COMMUNITY): Payer: Self-pay | Admitting: Obstetrics and Gynecology

## 2024-08-02 ENCOUNTER — Other Ambulatory Visit: Payer: Self-pay

## 2024-08-02 NOTE — Telephone Encounter (Signed)
 Copy of Beth's note from 08/01/24 was faxed to GYN

## 2024-08-02 NOTE — Progress Notes (Signed)
 SDW CALL  Patient was given pre-op instructions over the phone. The opportunity was given for the patient to ask questions. No further questions asked. Patient verbalized understanding of instructions given.   PCP - Darice Brownie Cardiologist - denies Pulmonologist - Almarie Ferrari  PPM/ICD - denies Device Orders - n/a Rep Notified - n/a  Chest x-ray - 11/28/23 EKG - 04/13/24 Stress Test - denies ECHO - 11/19/23 Cardiac Cath - denies  Sleep Study - pt reports she had a sleep study in 2025 which was negative for sleep apnea CPAP - n/a  No DM  Last dose of GLP1 agonist-  n/a GLP1 instructions:  n/a  Blood Thinner Instructions: Eliquis  - hold for 48 hours prior to surgery - patient aware that last dose should be on Friday, 1/23 Aspirin Instructions: n/a  ERAS Protcol - clears until 0745 PRE-SURGERY Ensure or G2- n/a  COVID TEST- n/a   Anesthesia review: yes - history of PE in May 2025  Patient denies shortness of breath, fever, cough and chest pain over the phone call   All instructions explained to the patient, with a verbal understanding of the material. Patient agrees to go over the instructions while at home for a better understanding.

## 2024-08-03 NOTE — Anesthesia Preprocedure Evaluation (Addendum)
 "                                  Anesthesia Evaluation  Patient identified by MRN, date of birth, ID band Patient awake    Reviewed: Allergy & Precautions, NPO status , Patient's Chart, lab work & pertinent test results  Airway Mallampati: II  TM Distance: >3 FB     Dental no notable dental hx. (+) Dental Advisory Given   Pulmonary former smoker, PE Massive Right PTE 11/18/23 in setting of OCP therapy   Pulmonary exam normal breath sounds clear to auscultation       Cardiovascular Normal cardiovascular exam Rhythm:Regular Rate:Normal  EKG 103/25 NSR with PAC  Echo 11/19/2023 (in setting of submassive PE, RLE DVT, now s/p anticoagulation therapy > 6 months): IMPRESSIONS   1. Left ventricular ejection fraction, by estimation, is 60 to 65%. The  left ventricle has normal function. The left ventricle has no regional  wall motion abnormalities. There is mild left ventricular hypertrophy.  Left ventricular diastolic parameters  were normal. There is the interventricular septum is flattened in systole  and diastole, consistent with right ventricular pressure and volume  overload.   2. Poor RV visualization but on image 83, appears McConnell's sign with  normal apical function but free wall hypokinesis, as can be seen in acute  PE. Right ventricular systolic function is moderately reduced. The right  ventricular size is mildly  enlarged. There is mildly elevated pulmonary artery systolic pressure. The  estimated right ventricular systolic pressure is 37.6 mmHg.   3. The mitral valve is normal in structure. Trivial mitral valve  regurgitation.   4. The aortic valve was not well visualized. Aortic valve regurgitation  is not visualized. No aortic stenosis is present.   5. Aortic dilatation noted. There is mild dilatation of the ascending  aorta, measuring 39 mm.   6. The inferior vena cava is normal in size with greater than 50%  respiratory variability, suggesting  right atrial pressure of 3 mmHg.     Neuro/Psych  PSYCHIATRIC DISORDERS Anxiety Depression    negative neurological ROS     GI/Hepatic Neg liver ROS,GERD  Medicated,,  Endo/Other  Obesity  Renal/GU negative Renal ROS  negative genitourinary   Musculoskeletal negative musculoskeletal ROS (+)    Abdominal  (+) + obese  Peds  Hematology  (+) Blood dyscrasia, anemia Eliquis  therapy- last dose 1/23  Lab Results      Component                Value               Date                      WBC                      6.9                 08/06/2024                HGB                      12.5                08/06/2024                HCT  37.9                08/06/2024                MCV                      82.6                08/06/2024                PLT                      305                 08/06/2024                Anesthesia Other Findings   Reproductive/Obstetrics negative OB ROS                              Anesthesia Physical Anesthesia Plan  ASA: 3  Anesthesia Plan: General   Post-op Pain Management: Dilaudid  IV, Precedex  and Ofirmev  IV (intra-op)*   Induction: Intravenous  PONV Risk Score and Plan: 4 or greater and Treatment may vary due to age or medical condition, Scopolamine  patch - Pre-op, Midazolam , Dexamethasone  and Ondansetron   Airway Management Planned: Oral ETT  Additional Equipment: None  Intra-op Plan:   Post-operative Plan: Extubation in OR  Informed Consent: I have reviewed the patients History and Physical, chart, labs and discussed the procedure including the risks, benefits and alternatives for the proposed anesthesia with the patient or authorized representative who has indicated his/her understanding and acceptance.     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

## 2024-08-05 NOTE — H&P (Signed)
 Tiffany English is an 39 y.o. female. Presenting for hysterectomy due to menorrhagia from fibroids.  She has had menorrhagia for years.   She used OCPS.  She had to stop due to a PE.   She had an endometrial ablation las year.   She still has heavy VB.   Endometrial curettings were benign.  She has to take eliquis  due to her history and would like a hysterecotomy for definitive treatment  Pertinent Gynecological History: Menses: flow is excessive with use of 7 pads or tampons on heaviest days Bleeding: dysfunctional uterine bleeding Contraception   abstinance DES exposure: denies Blood transfusions: none Sexually transmitted diseases: no past history Previous GYN Procedures: DNC and endometrial ablation  Last mammogram: NA Date:  Last pap: normal Date:  OB History: G0, P0   Menstrual History: Menarche age: 21 Patient's last menstrual period was 07/19/2024 (approximate).    Past Medical History:  Diagnosis Date   Abnormal uterine bleeding (AUB)    Anticoagulated    eliquis --- started 05/ 2025 for PE/ DVT ;  managed by hematology/ oncology,  6 month therapy   Anxiety    B12 deficiency    Depression    Folic acid  deficiency    Gallbladder polyp    GERD (gastroesophageal reflux disease)    History of pulmonary embolus (PE) 11/18/2023   (02-20-2024 pt stated never had clots prior )  ED/ admission;  CP/ SOB  dx acute bilateral submassive PE/ right LLE DVTs (popliteal/ posterior tibial/ peroneal veins);   likely provoked on OCP, d/cd on eliquis --- managed by oncology/ hematology and follow-up w/ pulmonary   IBS (irritable bowel syndrome)    Iron deficiency anemia due to chronic blood loss    hematology--- lacie burton NP   Irregular menses    Obesity    Pulmonary embolism (HCC)     Past Surgical History:  Procedure Laterality Date   COLONOSCOPY WITH ESOPHAGOGASTRODUODENOSCOPY (EGD)  2018   DILATION AND CURETTAGE OF UTERUS N/A 02/24/2024   Procedure: DILATION AND CURETTAGE;   Surgeon: Armond Cape, MD;  Location: [facility] OR;  Service: Gynecology;  Laterality: N/A;   HYDROTHERMAL ABLATION/NOVASURE N/A 02/24/2024   Procedure: HYDROTHERMAL ABLATION;  Surgeon: Armond Cape, MD;  Location: [facility] OR;  Service: Gynecology;  Laterality: N/A;   HYSTEROSCOPY N/A 02/24/2024   Procedure: ABLATION, ENDOMETRIUM, HYSTEROSCOPIC;  Surgeon: Armond Cape, MD;  Location: [facility] OR;  Service: Gynecology;  Laterality: N/A;   WISDOM TOOTH EXTRACTION  2017    Family History  Problem Relation Age of Onset   Colon cancer Other        uncle   Colon cancer Other        grandfather   Prostate cancer Other        uncle   Liver disease Other        great grandmother   Anxiety disorder Mother    Asthma Mother    COPD Mother    Depression Mother    Obesity Mother    Obesity Father    Diabetes Maternal Grandmother    Diabetes Paternal Grandmother    Varicose Veins Paternal Grandmother    Cancer Maternal Uncle    Cancer Maternal Aunt     Social History:  reports that she has quit smoking. Her smoking use included cigarettes. She does not have any smokeless tobacco history on file. She reports current alcohol use of about 4.0 - 8.0 standard drinks of alcohol per week. She reports that she does not currently use  drugs.  Allergies: Allergies[1]  No medications prior to admission.    Review of Systems  Last menstrual period 07/19/2024. Physical Exam Physical Examination: General appearance - alert, well appearing, and in no distress Chest - clear to auscultation, no wheezes, rales or rhonchi, symmetric air entry Heart - normal rate and regular rhythm Abdomen - soft, nontender, nondistended, no masses or organomegaly Pelvic - normal external genitalia, vulva, vagina, cervix, uterus and adnexa Extremities - Homan's sign negative bilaterally   No results found for this or any previous visit (from the past 24 hours).  No results found. CBC    Component Value Date/Time   WBC 7.5  04/12/2024 1009   RBC 3.26 (L) 04/12/2024 1009   HGB 8.2 (L) 04/12/2024 1009   HGB 12.5 01/04/2024 0945   HGB 11.0 (L) 11/28/2023 0952   HCT 27.1 (L) 04/12/2024 1009   HCT 38.6 11/28/2023 0952   PLT 342 04/12/2024 1009   PLT 333 01/04/2024 0945   PLT 585 (H) 11/28/2023 0952   MCV 83.1 04/12/2024 1009   MCV 74 (L) 11/28/2023 0952   MCH 25.2 (L) 04/12/2024 1009   MCHC 30.3 04/12/2024 1009   RDW 16.9 (H) 04/12/2024 1009   RDW 16.3 (H) 11/28/2023 0952   LYMPHSABS 2.2 04/12/2024 1009   MONOABS 0.4 04/12/2024 1009   EOSABS 0.0 04/12/2024 1009   BASOSABS 0.0 04/12/2024 1009     Assessment/Plan: Menorrhagia US  sig for 8 week fibroid uterus Pt s/p failed ablation Can not take hormonal treatment due to H/O of PE Anemia plan iron infusion may need blood.  Will type and cross for two units Pt undrstands the risks are but not limited to bleeding, infection,damage to intrnal organs like bowel and bladdr.  ] Plan LAVH possible TAH and cysto   Mihir Flanigan A Cienna Dumais 08/05/2024, 10:37 PM Date of Initial H&P: 06/05/25  History reviewed, patient examined, no change in status, stable for surgery.  Pt typed and crossed for two units.       [1]  Allergies Allergen Reactions   Lo Loestrin Fe [Norethin Ace-Eth Estrad-Fe] Other (See Comments)    PE secondary to blood clot

## 2024-08-06 ENCOUNTER — Ambulatory Visit (HOSPITAL_BASED_OUTPATIENT_CLINIC_OR_DEPARTMENT_OTHER): Payer: Self-pay | Admitting: Vascular Surgery

## 2024-08-06 ENCOUNTER — Encounter (HOSPITAL_COMMUNITY): Payer: Self-pay | Admitting: Vascular Surgery

## 2024-08-06 ENCOUNTER — Encounter (HOSPITAL_COMMUNITY): Admission: RE | Disposition: A | Payer: Self-pay | Source: Home / Self Care | Attending: Obstetrics and Gynecology

## 2024-08-06 ENCOUNTER — Observation Stay (HOSPITAL_COMMUNITY)
Admission: RE | Admit: 2024-08-06 | Discharge: 2024-08-07 | Disposition: A | Discharge status: Home / Self Care | Payer: Self-pay | Attending: Obstetrics and Gynecology | Admitting: Obstetrics and Gynecology

## 2024-08-06 ENCOUNTER — Encounter (HOSPITAL_COMMUNITY): Payer: Self-pay | Admitting: Obstetrics and Gynecology

## 2024-08-06 ENCOUNTER — Other Ambulatory Visit: Payer: Self-pay

## 2024-08-06 DIAGNOSIS — D259 Leiomyoma of uterus, unspecified: Secondary | ICD-10-CM

## 2024-08-06 DIAGNOSIS — N8311 Corpus luteum cyst of right ovary: Secondary | ICD-10-CM | POA: Insufficient documentation

## 2024-08-06 DIAGNOSIS — D251 Intramural leiomyoma of uterus: Secondary | ICD-10-CM | POA: Diagnosis not present

## 2024-08-06 DIAGNOSIS — Z01818 Encounter for other preprocedural examination: Principal | ICD-10-CM

## 2024-08-06 DIAGNOSIS — N920 Excessive and frequent menstruation with regular cycle: Secondary | ICD-10-CM | POA: Diagnosis present

## 2024-08-06 DIAGNOSIS — F418 Other specified anxiety disorders: Secondary | ICD-10-CM | POA: Diagnosis not present

## 2024-08-06 DIAGNOSIS — Z87891 Personal history of nicotine dependence: Secondary | ICD-10-CM | POA: Insufficient documentation

## 2024-08-06 DIAGNOSIS — E669 Obesity, unspecified: Secondary | ICD-10-CM

## 2024-08-06 DIAGNOSIS — Z90710 Acquired absence of both cervix and uterus: Secondary | ICD-10-CM | POA: Diagnosis present

## 2024-08-06 DIAGNOSIS — N888 Other specified noninflammatory disorders of cervix uteri: Secondary | ICD-10-CM | POA: Insufficient documentation

## 2024-08-06 DIAGNOSIS — N8003 Adenomyosis of the uterus: Secondary | ICD-10-CM | POA: Diagnosis present

## 2024-08-06 DIAGNOSIS — Z6838 Body mass index (BMI) 38.0-38.9, adult: Secondary | ICD-10-CM

## 2024-08-06 HISTORY — DX: Other pulmonary embolism without acute cor pulmonale: I26.99

## 2024-08-06 LAB — CBC
HCT: 37.9 % (ref 36.0–46.0)
HCT: 39.1 % (ref 36.0–46.0)
Hemoglobin: 12.5 g/dL (ref 12.0–15.0)
Hemoglobin: 12.3 g/dL (ref 12.0–15.0)
MCH: 27.2 pg (ref 26.0–34.0)
MCH: 26.9 pg (ref 26.0–34.0)
MCHC: 33 g/dL (ref 30.0–36.0)
MCHC: 31.5 g/dL (ref 30.0–36.0)
MCV: 82.6 fL (ref 80.0–100.0)
MCV: 85.4 fL (ref 80.0–100.0)
Platelets: 305 10*3/uL (ref 150–400)
Platelets: 321 10*3/uL (ref 150–400)
RBC: 4.59 MIL/uL (ref 3.87–5.11)
RBC: 4.58 MIL/uL (ref 3.87–5.11)
RDW: 16.6 % — ABNORMAL HIGH (ref 11.5–15.5)
RDW: 16.7 % — ABNORMAL HIGH (ref 11.5–15.5)
WBC: 6.9 10*3/uL (ref 4.0–10.5)
WBC: 18.2 10*3/uL — ABNORMAL HIGH (ref 4.0–10.5)
nRBC: 0 % (ref 0.0–0.2)
nRBC: 0 % (ref 0.0–0.2)

## 2024-08-06 LAB — BASIC METABOLIC PANEL WITH GFR
Anion gap: 12 (ref 5–15)
BUN: 12 mg/dL (ref 6–20)
CO2: 20 mmol/L — ABNORMAL LOW (ref 22–32)
Calcium: 8.7 mg/dL — ABNORMAL LOW (ref 8.9–10.3)
Chloride: 103 mmol/L (ref 98–111)
Creatinine, Ser: 0.74 mg/dL (ref 0.44–1.00)
GFR, Estimated: >60 mL/min
Glucose, Bld: 100 mg/dL — ABNORMAL HIGH (ref 70–99)
Potassium: 4 mmol/L (ref 3.5–5.1)
Sodium: 135 mmol/L (ref 135–145)

## 2024-08-06 LAB — CREATININE, SERUM
Creatinine, Ser: 0.77 mg/dL (ref 0.44–1.00)
GFR, Estimated: >60 mL/min

## 2024-08-06 LAB — POCT PREGNANCY, URINE: Preg Test, Ur: NEGATIVE

## 2024-08-06 LAB — PREPARE RBC (CROSSMATCH)

## 2024-08-06 MED ORDER — ACETAMINOPHEN 10 MG/ML IV SOLN
INTRAVENOUS | Status: DC | PRN
  Administered 2024-08-06: 1000 mg via INTRAVENOUS

## 2024-08-06 MED ORDER — VITAMIN B-12 1000 MCG PO TABS
1000.0000 ug | ORAL_TABLET | Freq: Every day | ORAL | Status: DC
  Filled 2024-08-06: qty 1

## 2024-08-06 MED ORDER — LIDOCAINE 2% (20 MG/ML) 5 ML SYRINGE
INTRAMUSCULAR | Status: AC
  Filled 2024-08-06: qty 5

## 2024-08-06 MED ORDER — HYDROMORPHONE HCL 1 MG/ML IJ SOLN
INTRAMUSCULAR | Status: DC | PRN
  Administered 2024-08-06: .5 mg via INTRAVENOUS

## 2024-08-06 MED ORDER — ORAL CARE MOUTH RINSE: 15.0000 mL | Freq: Once | OROMUCOSAL | Status: AC

## 2024-08-06 MED ORDER — PANTOPRAZOLE SODIUM 40 MG PO TBEC
40.0000 mg | DELAYED_RELEASE_TABLET | Freq: Every day | ORAL | Status: DC
  Administered 2024-08-07: 40 mg via ORAL
  Filled 2024-08-06: qty 1

## 2024-08-06 MED ORDER — MIDAZOLAM HCL 2 MG/2ML IJ SOLN
INTRAMUSCULAR | Status: AC
  Filled 2024-08-06: qty 2

## 2024-08-06 MED ORDER — PROPOFOL 10 MG/ML IV BOLUS
INTRAVENOUS | Status: AC
  Filled 2024-08-06: qty 20

## 2024-08-06 MED ORDER — OXYCODONE HCL 5 MG PO TABS: 5.0000 mg | ORAL_TABLET | Freq: Once | ORAL | Status: DC | PRN

## 2024-08-06 MED ORDER — CHLORHEXIDINE GLUCONATE 0.12 % MT SOLN
15.0000 mL | Freq: Once | OROMUCOSAL | Status: AC
  Administered 2024-08-06: 15 mL via OROMUCOSAL
  Filled 2024-08-06: qty 15

## 2024-08-06 MED ORDER — DROPERIDOL 2.5 MG/ML IJ SOLN: 0.6250 mg | Freq: Once | INTRAMUSCULAR | Status: DC | PRN

## 2024-08-06 MED ORDER — FENTANYL CITRATE (PF) 250 MCG/5ML IJ SOLN
INTRAMUSCULAR | Status: AC
  Filled 2024-08-06: qty 5

## 2024-08-06 MED ORDER — ONDANSETRON HCL 4 MG/2ML IJ SOLN: 4.0000 mg | Freq: Four times a day (QID) | INTRAMUSCULAR | Status: DC | PRN

## 2024-08-06 MED ORDER — GLYCOPYRROLATE PF 0.2 MG/ML IJ SOSY
PREFILLED_SYRINGE | INTRAMUSCULAR | Status: DC | PRN
  Administered 2024-08-06: .1 mg via INTRAVENOUS

## 2024-08-06 MED ORDER — BUPROPION HCL 100 MG PO TABS
100.0000 mg | ORAL_TABLET | Freq: Every day | ORAL | Status: DC
  Filled 2024-08-06: qty 1

## 2024-08-06 MED ORDER — HEMOSTATIC AGENTS (NO CHARGE) OPTIME
TOPICAL | Status: DC | PRN
  Administered 2024-08-06: 1 via TOPICAL

## 2024-08-06 MED ORDER — ROCURONIUM BROMIDE 10 MG/ML (PF) SYRINGE
PREFILLED_SYRINGE | INTRAVENOUS | Status: AC
  Filled 2024-08-06: qty 10

## 2024-08-06 MED ORDER — NALOXONE HCL 0.4 MG/ML IJ SOLN: 0.4000 mg | INTRAMUSCULAR | Status: DC | PRN

## 2024-08-06 MED ORDER — FLUORESCEIN SODIUM 10 % IV SOLN
INTRAVENOUS | Status: DC | PRN
  Administered 2024-08-06: 1 mL via INTRAVENOUS

## 2024-08-06 MED ORDER — FENTANYL CITRATE (PF) 250 MCG/5ML IJ SOLN
INTRAMUSCULAR | Status: DC | PRN
  Administered 2024-08-06: 50 ug via INTRAVENOUS
  Administered 2024-08-06: 100 ug via INTRAVENOUS
  Administered 2024-08-06: 50 ug via INTRAVENOUS
  Administered 2024-08-06: 25 ug via INTRAVENOUS
  Administered 2024-08-06: 100 ug via INTRAVENOUS
  Administered 2024-08-06: 25 ug via INTRAVENOUS

## 2024-08-06 MED ORDER — POVIDONE-IODINE 10 % EX SWAB
2.0000 | Freq: Once | CUTANEOUS | Status: AC
  Administered 2024-08-06: 2 via TOPICAL

## 2024-08-06 MED ORDER — ONDANSETRON HCL 4 MG/2ML IJ SOLN
INTRAMUSCULAR | Status: AC
  Filled 2024-08-06: qty 2

## 2024-08-06 MED ORDER — LIDOCAINE 2% (20 MG/ML) 5 ML SYRINGE
INTRAMUSCULAR | Status: DC | PRN
  Administered 2024-08-06: 100 mg via INTRAVENOUS

## 2024-08-06 MED ORDER — HYDROMORPHONE HCL 1 MG/ML IJ SOLN
0.2500 mg | INTRAMUSCULAR | Status: DC | PRN
  Administered 2024-08-06: 0.25 mg via INTRAVENOUS
  Administered 2024-08-06 (×3): 0.5 mg via INTRAVENOUS
  Administered 2024-08-06: 0.25 mg via INTRAVENOUS

## 2024-08-06 MED ORDER — ENOXAPARIN SODIUM 80 MG/0.8ML IJ SOSY
70.0000 mg | PREFILLED_SYRINGE | INTRAMUSCULAR | Status: DC
  Administered 2024-08-07: 70 mg via SUBCUTANEOUS
  Filled 2024-08-06: qty 0.8

## 2024-08-06 MED ORDER — BUPIVACAINE HCL (PF) 0.25 % IJ SOLN
INTRAMUSCULAR | Status: DC | PRN
  Administered 2024-08-06: 14 mL

## 2024-08-06 MED ORDER — DEXMEDETOMIDINE HCL IN NACL 80 MCG/20ML IV SOLN
INTRAVENOUS | Status: DC | PRN
  Administered 2024-08-06 (×2): 8 ug via INTRAVENOUS

## 2024-08-06 MED ORDER — SODIUM CHLORIDE 0.9% FLUSH: 9.0000 mL | INTRAVENOUS | Status: DC | PRN

## 2024-08-06 MED ORDER — ONDANSETRON HCL 4 MG/2ML IJ SOLN
INTRAMUSCULAR | Status: DC | PRN
  Administered 2024-08-06: 4 mg via INTRAVENOUS

## 2024-08-06 MED ORDER — BUPIVACAINE HCL (PF) 0.25 % IJ SOLN
INTRAMUSCULAR | Status: AC
  Filled 2024-08-06: qty 30

## 2024-08-06 MED ORDER — KETOROLAC TROMETHAMINE 30 MG/ML IJ SOLN
INTRAMUSCULAR | Status: AC
  Filled 2024-08-06: qty 1

## 2024-08-06 MED ORDER — PROPOFOL 10 MG/ML IV BOLUS
INTRAVENOUS | Status: DC | PRN
  Administered 2024-08-06: 200 mg via INTRAVENOUS

## 2024-08-06 MED ORDER — OXYCODONE HCL 5 MG/5ML PO SOLN: 5.0000 mg | Freq: Once | ORAL | Status: DC | PRN

## 2024-08-06 MED ORDER — CLONAZEPAM 0.5 MG PO TABS: 0.5000 mg | ORAL_TABLET | Freq: Every day | ORAL | Status: DC | PRN

## 2024-08-06 MED ORDER — DEXAMETHASONE SOD PHOSPHATE PF 10 MG/ML IJ SOLN
INTRAMUSCULAR | Status: AC
  Filled 2024-08-06: qty 1

## 2024-08-06 MED ORDER — DEXAMETHASONE SOD PHOSPHATE PF 10 MG/ML IJ SOLN
INTRAMUSCULAR | Status: DC | PRN
  Administered 2024-08-06: 10 mg via INTRAVENOUS

## 2024-08-06 MED ORDER — FENTANYL CITRATE (PF) 100 MCG/2ML IJ SOLN
INTRAMUSCULAR | Status: AC
  Filled 2024-08-06: qty 2

## 2024-08-06 MED ORDER — ESCITALOPRAM OXALATE 10 MG PO TABS
20.0000 mg | ORAL_TABLET | Freq: Every day | ORAL | Status: DC
  Filled 2024-08-06: qty 2

## 2024-08-06 MED ORDER — ROCURONIUM BROMIDE 10 MG/ML (PF) SYRINGE
PREFILLED_SYRINGE | INTRAVENOUS | Status: DC | PRN
  Administered 2024-08-06 (×2): 10 mg via INTRAVENOUS
  Administered 2024-08-06: 50 mg via INTRAVENOUS
  Administered 2024-08-06: 40 mg via INTRAVENOUS
  Administered 2024-08-06: 100 mg via INTRAVENOUS
  Administered 2024-08-06: 30 mg via INTRAVENOUS
  Administered 2024-08-06: 50 mg via INTRAVENOUS
  Administered 2024-08-06: 30 mg via INTRAVENOUS

## 2024-08-06 MED ORDER — HYDROMORPHONE HCL 1 MG/ML IJ SOLN
INTRAMUSCULAR | Status: AC
  Filled 2024-08-06: qty 1

## 2024-08-06 MED ORDER — 0.9 % SODIUM CHLORIDE (POUR BTL) OPTIME
TOPICAL | Status: DC | PRN
  Administered 2024-08-06: 1000 mL

## 2024-08-06 MED ORDER — ROCURONIUM BROMIDE 10 MG/ML (PF) SYRINGE
PREFILLED_SYRINGE | INTRAVENOUS | Status: AC
  Filled 2024-08-06: qty 20

## 2024-08-06 MED ORDER — ESTRADIOL 0.01 % VA CREA
TOPICAL_CREAM | VAGINAL | Status: AC
  Filled 2024-08-06: qty 42.5

## 2024-08-06 MED ORDER — VASOPRESSIN 20 UNIT/ML IV SOLN
INTRAVENOUS | Status: AC
  Filled 2024-08-06: qty 1

## 2024-08-06 MED ORDER — DIPHENHYDRAMINE HCL 12.5 MG/5ML PO ELIX: 12.5000 mg | ORAL_SOLUTION | Freq: Four times a day (QID) | ORAL | Status: DC | PRN

## 2024-08-06 MED ORDER — ACETAMINOPHEN 500 MG PO TABS
1000.0000 mg | ORAL_TABLET | Freq: Four times a day (QID) | ORAL | Status: DC
  Administered 2024-08-06 – 2024-08-07 (×3): 1000 mg via ORAL
  Filled 2024-08-06 (×3): qty 2

## 2024-08-06 MED ORDER — LACTATED RINGERS IV SOLN: INTRAVENOUS | Status: DC

## 2024-08-06 MED ORDER — HYDROMORPHONE HCL 1 MG/ML IJ SOLN
INTRAMUSCULAR | Status: AC
  Filled 2024-08-06: qty 0.5

## 2024-08-06 MED ORDER — SCOPOLAMINE 1 MG/3DAYS TD PT72
1.0000 | MEDICATED_PATCH | TRANSDERMAL | Status: DC
  Administered 2024-08-06: 1 mg via TRANSDERMAL
  Filled 2024-08-06: qty 1

## 2024-08-06 MED ORDER — SODIUM CHLORIDE 0.9% IV SOLUTION: Freq: Once | INTRAVENOUS | Status: DC

## 2024-08-06 MED ORDER — LANSOPRAZOLE 30 MG PO CPDR: 30.0000 mg | DELAYED_RELEASE_CAPSULE | Freq: Every day | ORAL | Status: DC

## 2024-08-06 MED ORDER — EPHEDRINE SULFATE-NACL 50-0.9 MG/10ML-% IV SOSY
PREFILLED_SYRINGE | INTRAVENOUS | Status: DC | PRN
  Administered 2024-08-06 (×2): 5 mg via INTRAVENOUS

## 2024-08-06 MED ORDER — DIPHENHYDRAMINE HCL 50 MG/ML IJ SOLN: 12.5000 mg | Freq: Four times a day (QID) | INTRAMUSCULAR | Status: DC | PRN

## 2024-08-06 MED ORDER — ONDANSETRON HCL 4 MG/2ML IJ SOLN: 4.0000 mg | Freq: Once | INTRAMUSCULAR | Status: DC | PRN

## 2024-08-06 MED ORDER — SUGAMMADEX SODIUM 200 MG/2ML IV SOLN
INTRAVENOUS | Status: DC | PRN
  Administered 2024-08-06: 100 mg via INTRAVENOUS
  Administered 2024-08-06: 200 mg via INTRAVENOUS

## 2024-08-06 MED ORDER — FOLIC ACID 1 MG PO TABS
1.0000 mg | ORAL_TABLET | Freq: Every day | ORAL | Status: DC
  Administered 2024-08-07: 1 mg via ORAL
  Filled 2024-08-06: qty 1

## 2024-08-06 MED ORDER — OXYCODONE HCL 5 MG PO TABS
5.0000 mg | ORAL_TABLET | ORAL | Status: DC | PRN
  Administered 2024-08-07: 10 mg via ORAL
  Filled 2024-08-06: qty 2

## 2024-08-06 MED ORDER — MIDAZOLAM HCL (PF) 2 MG/2ML IJ SOLN
INTRAMUSCULAR | Status: DC | PRN
  Administered 2024-08-06: 2 mg via INTRAVENOUS

## 2024-08-06 MED ORDER — CEFAZOLIN SODIUM-DEXTROSE 3-4 GM/150ML-% IV SOLN
3.0000 g | INTRAVENOUS | Status: AC
  Administered 2024-08-06 (×2): 3 g via INTRAVENOUS
  Filled 2024-08-06: qty 150

## 2024-08-06 MED ORDER — VASOPRESSIN 20 UNIT/ML IV SOLN
INTRAVENOUS | Status: DC | PRN
  Administered 2024-08-06: 18 mL via INTRAMUSCULAR

## 2024-08-06 MED ORDER — MENTHOL 3 MG MT LOZG: 1.0000 | LOZENGE | OROMUCOSAL | Status: DC | PRN

## 2024-08-06 MED ORDER — BUSPIRONE HCL 10 MG PO TABS
10.0000 mg | ORAL_TABLET | Freq: Every day | ORAL | Status: DC
  Filled 2024-08-06: qty 1

## 2024-08-06 MED ORDER — FLUORESCEIN SODIUM 10 % IV SOLN
INTRAVENOUS | Status: AC
  Filled 2024-08-06: qty 5

## 2024-08-06 MED ORDER — SODIUM CHLORIDE 0.9 % IR SOLN
Status: DC | PRN
  Administered 2024-08-06: 1000 mL
  Administered 2024-08-06: 2000 mL

## 2024-08-06 MED ORDER — HYDROMORPHONE 1 MG/ML IV SOLN
INTRAVENOUS | Status: DC
  Administered 2024-08-06: 30 mg via INTRAVENOUS
  Administered 2024-08-07: 2.2 mg via INTRAVENOUS
  Filled 2024-08-06: qty 30

## 2024-08-06 MED ORDER — SODIUM CHLORIDE (PF) 0.9 % IJ SOLN
INTRAMUSCULAR | Status: AC
  Filled 2024-08-06: qty 100

## 2024-08-06 NOTE — Transfer of Care (Signed)
 Immediate Anesthesia Transfer of Care Note  Patient: Tiffany English  Procedure(s) Performed: HYSTERECTOMY, VAGINAL, LAPAROSCOPY-ASSISTED, WITH SALPINGECTOMY (Bilateral: Uterus) CYSTOSCOPY (Bilateral) EXCISION, CYST, OVARY (Right: Uterus) SIGMOIDOSCOPY (Rectum)  Patient Location: PACU  Anesthesia Type:General  Level of Consciousness: awake, alert , and oriented  Airway & Oxygen Therapy: Patient Spontanous Breathing and Patient connected to face mask oxygen  Post-op Assessment: Report given to RN and Post -op Vital signs reviewed and stable  Post vital signs: Reviewed and stable  Last Vitals:  Vitals Value Taken Time  BP 114/59 08/06/24 17:05  Temp    Pulse 80 08/06/24 17:08  Resp 16 08/06/24 17:08  SpO2 97 % 08/06/24 17:08  Vitals shown include unfiled device data.  Last Pain:  Vitals:   08/06/24 0844  TempSrc:   PainSc: 0-No pain      Patients Stated Pain Goal: 0 (08/06/24 0844)  Complications: No notable events documented.

## 2024-08-06 NOTE — Op Note (Signed)
 Pre op diagnosis: menorrhagia.  Failed ablation. fibroids  Postop Diagnosis:same  with right ovarian cyst  Procedure: LAVH, B salpingectmy , Right ovarian cystectomy  Cystoscopy  Anesthesia: General   Anesthesiologist: Dr Jerrye   Attending: Ovid DELENA All, MD   Assistant: Minta Garbe PA  Findings: 8 week size fibroid uterus.  Normal appearing tubes.   4 cm right ovarian cyst with chocolate filled liquid.    Pathology: uterus and cervix and bilateral tubes, right ovarian cyst wall.   Fluids: 1700 cc  cccrystalloid  UOP: 450 cccc  EBL: 350cc  Complications:none Procedure LAVH, Cysto, right ovarian cystectomy ,  procotoscope by Dr Dann  Procedure: The patient was taken to the operating room, placed under general anesthesia and prepped and draped in the normal sterile fashion. A Foley catheter was placed in the bladder. A weighted speculum and vaginal retractors were placed in the vagina. Tenaculum was placed on the anterior lip of the cervix.  A hulka manipulator was placed in the uterus. Attention was then turned to the abdomen. A 10 mm infraumbilical incision was made with the scalpel after 5 cc of 25% percent Marcaine  was used for local anesthesia. The subcutaneous tissue was dissected and the fascia was incised with the knife. A purse string stitch was placed in the fascia and Hassan placed into the intra-abdominal cavity and anchored to the suture. Intraabdominal placement was confirmed with the laparoscope.  Two 5 mm trochars were placed in the right and left lower quadrants under direct visualization with the laparoscope.   .   Both round ligaments were cauterized and cut with ligasure cautery as well and the bladder flap created with the ligasure  and hydrodisection using the nigat and removed away from the uterus.   The left fallopian tube was cauterized and removed.    The left uterine ovarian ligament was then cauterized and cut.  The right fallopian tube was cauterized cut  and removed.  The right utero-ovarian ligament was cauterized and cut with the tripolar cautery gyrus. . The right ovarian cyst wall was removed and sent to pathology.  Chocolate colred fluid was seen to come out of the cyst . The cyst wall was removed using tration and counter traction.   We had to reposition the patients legs and arms.  They appeared mal positioned.   This took about 20 minutes and I resrubbed.    Attention was then turned to the vagina.  A weighted speculum was placed in the posterior fourchette.  petrussin mixture was placed circumferentially around the cervix.  With blunt and sharp dissection the cervix was dissected away from the bowel and bladder.  Both uterosacral ligaments were clamped, cut and suture ligated and held.  The anterior and posterior culdesac was entered sharply using metzenbaum scissors.  There was fat seen along the posterior aspect of the culdesac.  The cardinal ligaments and  The uterine arteries were clamped, cut and suture ligated bilaterally.    The uterus was then delivered.    The vaginal cuff was closed with interrupted suture of 0 chromic.   the patient was given fluroscein.    Cystoscopy was performed and both ureters were seen to efflux indigo fluroscein.   without difficulty. The bladder had full integrity with no suture or laceration visualized.  The vagina was inspected and the cuff was noted to be intact.  Due to the fat seen during the posterior culdesac dissection, general surgery was asked to evaluate the rectum for any damage.  Dr Dann did a proctoscopy and it was WNL.  Attention was then turned back to the abdomen after removing top pair of gloves. The abdomen was reinsufflated with CO2 gas.   The abdomen and pelvis was copiously irrigated.       hemostasis was noted.  Surgicel powder  was placed along the cuff.    All trochars were removed under direct visualization using the laparoscope.  The umbilical fascia was reapproximated by tying the  circumferential suture. The two 5 mm incisions were closed with 3-0 Monocryl via a subcuticular stitch.  All remaining skin incisions were closed with Dermabond and the 10 mm skin incisions were reinforced using Dermabond.  Sponge lap and needle counts were correct.  The patient tolerated the procedure well and was returned to the PACU in stable condition

## 2024-08-06 NOTE — Op Note (Signed)
" °  08/06/2024  4:11 PM  PATIENT:  Tiffany English  39 y.o. female  PRE-OPERATIVE DIAGNOSIS:  Excessive or frequent menstruation with regular cycle, need for evaluation of rectum  POST-OPERATIVE DIAGNOSIS:  Excessive or frequent menstruation with regular cycle, no evidence of rectal injury  PROCEDURE:  Procedures: EXAMINATION UNDER ANESTHESIA RIGID PROCTOSCOPY  SURGEON:  Dann Hummer, MD  ASSISTANTS: Ovid All, MD   ANESTHESIA:   general  EBL:  Total I/O In: 1750 [I.V.:1500; IV Piggyback:250] Out: 1050 [Urine:650; Blood:400]  BLOOD ADMINISTERED:none  SPECIMEN:  No Specimen  DISPOSITION OF SPECIMEN:  N/A  COUNTS:  YES  DICTATION: .Dragon Dictation Dr. All requested intraoperative consult to evaluate this patient's rectum.  They are undergoing a laparoscopic-assisted vaginal hysterectomy and there was some concern regarding the dissection around the anterior rectum.  My partner, Dr. Ebbie, evaluated the patient briefly and felt a rigid proctoscopy was warranted.  I agreed.  When I arrived, the patient was under general anesthesia.  I scrubbed into the case and did a digital rectal exam and further evaluation under anesthesia.  There was no blood or palpable abnormality.  I did did a rigid proctoscopy up to about 17 cm.  There was no evidence of blood in the rectum, rectal injury, or rectal abnormality.  The scope was removed and the patient remained in the operating room with Dr. All.  No complications and counts from my portion of the procedure were correct. PATIENT DISPOSITION:  remains in OR with Dr. All   Delay start of Pharmacological VTE agent (>24hrs) due to surgical blood loss or risk of bleeding:  no  Dann Hummer, MD, MPH, FACS Pager: [phone]  1/26/20264:11 PM              "

## 2024-08-06 NOTE — Anesthesia Procedure Notes (Signed)
 Procedure Name: Intubation Date/Time: 08/06/2024 11:33 AM  Performed by: Elby Raelene SAUNDERS, CRNAPre-anesthesia Checklist: Patient identified, Emergency Drugs available, Suction available and Patient being monitored Patient Re-evaluated:Patient Re-evaluated prior to induction Oxygen Delivery Method: Circle System Utilized Preoxygenation: Pre-oxygenation with 100% oxygen Induction Type: IV induction Ventilation: Mask ventilation without difficulty Laryngoscope Size: Miller and 2 Grade View: Grade I Tube type: Oral Tube size: 7.0 mm Number of attempts: 1 Airway Equipment and Method: Stylet Placement Confirmation: ETT inserted through vocal cords under direct vision, positive ETCO2 and breath sounds checked- equal and bilateral Secured at: 22 cm Tube secured with: Tape Dental Injury: Teeth and Oropharynx as per pre-operative assessment

## 2024-08-06 NOTE — Anesthesia Postprocedure Evaluation (Signed)
"   Anesthesia Post Note  Patient: Tiffany English  Procedure(s) Performed: HYSTERECTOMY, VAGINAL, LAPAROSCOPY-ASSISTED, WITH SALPINGECTOMY (Bilateral: Uterus) CYSTOSCOPY (Bilateral) EXCISION, CYST, OVARY (Right: Uterus) SIGMOIDOSCOPY (Rectum)     Patient location during evaluation: PACU Anesthesia Type: General Level of consciousness: awake and alert and oriented Pain management: pain level controlled Vital Signs Assessment: post-procedure vital signs reviewed and stable Respiratory status: spontaneous breathing, nonlabored ventilation and respiratory function stable Cardiovascular status: blood pressure returned to baseline and stable Postop Assessment: no apparent nausea or vomiting Anesthetic complications: no   No notable events documented.  Last Vitals:  Vitals:   08/06/24 1715 08/06/24 1730  BP: 123/79 121/77  Pulse: 73 72  Resp: 15 20  Temp:    SpO2: 98% 93%    Last Pain:  Vitals:   08/06/24 1738  TempSrc:   PainSc: 4    Pain Goal: Patients Stated Pain Goal: 4 (08/06/24 1730)                 [name] A.      "

## 2024-08-06 NOTE — Progress Notes (Signed)
*   Day of Surgery * Procedures (LRB): HYSTERECTOMY, VAGINAL, LAPAROSCOPY-ASSISTED, WITH SALPINGECTOMY (Bilateral) CYSTOSCOPY (Bilateral) EXCISION, CYST, OVARY (Right) SIGMOIDOSCOPY  Subjective: Patient reports {sub:3041132}.    Objective: I have reviewed patient's {reviewed:3041135}.  General: {Exam; general:16600} Resp: {Exam; lung:16931} Cardio: {Exam; heart:5510} GI: {Exam, HP:6958863} Extremities: {Exam; extremity:5109} Vaginal Bleeding: {exam; vaginal bleeding:3041122}  Assessment: s/p Procedures: HYSTERECTOMY, VAGINAL, LAPAROSCOPY-ASSISTED, WITH SALPINGECTOMY (Bilateral) CYSTOSCOPY (Bilateral) EXCISION, CYST, OVARY (Right) SIGMOIDOSCOPY: {assessment details:3041134}  Plan: {eojw:6958866}  LOS: 0 days    Tiffany English [place], PA 08/06/2024, 5:17 PM

## 2024-08-07 ENCOUNTER — Encounter (HOSPITAL_COMMUNITY): Payer: Self-pay | Admitting: Obstetrics and Gynecology

## 2024-08-07 DIAGNOSIS — N8003 Adenomyosis of the uterus: Secondary | ICD-10-CM | POA: Diagnosis not present

## 2024-08-07 LAB — BASIC METABOLIC PANEL WITH GFR
Anion gap: 10 (ref 5–15)
BUN: 7 mg/dL (ref 6–20)
CO2: 24 mmol/L (ref 22–32)
Calcium: 8.6 mg/dL — ABNORMAL LOW (ref 8.9–10.3)
Chloride: 99 mmol/L (ref 98–111)
Creatinine, Ser: 0.68 mg/dL (ref 0.44–1.00)
GFR, Estimated: >60 mL/min
Glucose, Bld: 114 mg/dL — ABNORMAL HIGH (ref 70–99)
Potassium: 4.3 mmol/L (ref 3.5–5.1)
Sodium: 133 mmol/L — ABNORMAL LOW (ref 135–145)

## 2024-08-07 LAB — CBC
HCT: 35 % — ABNORMAL LOW (ref 36.0–46.0)
Hemoglobin: 11.1 g/dL — ABNORMAL LOW (ref 12.0–15.0)
MCH: 27.3 pg (ref 26.0–34.0)
MCHC: 31.7 g/dL (ref 30.0–36.0)
MCV: 86.2 fL (ref 80.0–100.0)
Platelets: 297 10*3/uL (ref 150–400)
RBC: 4.06 MIL/uL (ref 3.87–5.11)
RDW: 16.5 % — ABNORMAL HIGH (ref 11.5–15.5)
WBC: 15.6 10*3/uL — ABNORMAL HIGH (ref 4.0–10.5)
nRBC: 0 % (ref 0.0–0.2)

## 2024-08-07 MED ORDER — ONDANSETRON 4 MG PO TBDP: 4.0000 mg | ORAL_TABLET | Freq: Three times a day (TID) | ORAL | 0 refills | Status: AC | PRN

## 2024-08-07 MED ORDER — OXYCODONE HCL 5 MG PO TABS: 5.0000 mg | ORAL_TABLET | ORAL | 0 refills | Status: AC | PRN

## 2024-08-07 MED ORDER — ACETAMINOPHEN 500 MG PO TABS: 1000.0000 mg | ORAL_TABLET | Freq: Four times a day (QID) | ORAL | 0 refills | Status: AC

## 2024-08-07 NOTE — Plan of Care (Signed)
" °  Problem: Education: Goal: Knowledge of General Education information will improve Description: Including pain rating scale, medication(s)/side effects and non-pharmacologic comfort measures Outcome: Progressing   Problem: Health Behavior/Discharge Planning: Goal: Ability to manage health-related needs will improve Outcome: Progressing   Problem: Clinical Measurements: Goal: Ability to maintain clinical measurements within normal limits will improve Outcome: Progressing Goal: Will remain free from infection Outcome: Progressing Goal: Diagnostic test results will improve Outcome: Progressing Goal: Respiratory complications will improve Outcome: Progressing Goal: Cardiovascular complication will be avoided Outcome: Progressing   Problem: Activity: Goal: Risk for activity intolerance will decrease Outcome: Progressing   Problem: Nutrition: Goal: Adequate nutrition will be maintained Outcome: Progressing   Problem: Coping: Goal: Level of anxiety will decrease Outcome: Progressing   Problem: Elimination: Goal: Will not experience complications related to bowel motility Outcome: Progressing Goal: Will not experience complications related to urinary retention Outcome: Progressing   Problem: Pain Managment: Goal: General experience of comfort will improve and/or be controlled Outcome: Progressing   Problem: Safety: Goal: Ability to remain free from injury will improve Outcome: Progressing   Problem: Skin Integrity: Goal: Risk for impaired skin integrity will decrease Outcome: Progressing   Problem: Education: Goal: Knowledge of the prescribed therapeutic regimen will improve Outcome: Progressing   Problem: Bowel/Gastric: Goal: Gastrointestinal status for postoperative course will improve Outcome: Progressing   Problem: Cardiac: Goal: Ability to maintain an adequate cardiac output Outcome: Progressing Goal: Will show no evidence of cardiac arrhythmias Outcome:  Progressing   Problem: Nutritional: Goal: Will attain and maintain optimal nutritional status Outcome: Progressing   Problem: Neurological: Goal: Will regain or maintain usual level of consciousness Outcome: Progressing   Problem: Clinical Measurements: Goal: Ability to maintain clinical measurements within normal limits Outcome: Progressing Goal: Postoperative complications will be avoided or minimized Outcome: Progressing   Problem: Respiratory: Goal: Will regain and/or maintain adequate ventilation Outcome: Progressing Goal: Respiratory status will improve Outcome: Progressing   Problem: Skin Integrity: Goal: Demonstrates signs of wound healing without infection Outcome: Progressing   Problem: Urinary Elimination: Goal: Will remain free from infection Outcome: Progressing Goal: Ability to achieve and maintain adequate urine output Outcome: Progressing   Problem: Education: Goal: Knowledge of the prescribed therapeutic regimen will improve Outcome: Progressing Goal: Understanding of sexual limitations or changes related to disease process or condition will improve Outcome: Progressing Goal: Individualized Educational Video(s) Outcome: Progressing   Problem: Self-Concept: Goal: Communication of feelings regarding changes in body function or appearance will improve Outcome: Progressing   Problem: Skin Integrity: Goal: Demonstration of wound healing without infection will improve Outcome: Progressing   "

## 2024-08-07 NOTE — Discharge Summary (Signed)
 Physician Discharge Summary  Patient ID: Tiffany English MRN: [id number] DOB/AGE: 16-Feb-1986 38 y.o.  Admit date: 08/06/2024 Discharge date: 08/07/2024  Admission Diagnoses:  Discharge Diagnoses:  Principal Problem:   Menorrhagia with regular cycle Active Problems:   S/P laparoscopic assisted vaginal hysterectomy (LAVH)   Discharged Condition: good  Hospital Course: Pt had an LAVH 08/06/24/   she has tolerated food and has good bowel function.  Her pain is well controlled,  Consults: intra-op consult with general surgery  Significant Diagnostic Studies: labs:   Treatments: IV hydration, antibiotics: Ancef , and surgery:   Discharge Exam: Blood pressure 131/84, pulse (!) 112, temperature 97.8 F (36.6 C), temperature source Oral, resp. rate 18, height 6' 2 (1.88 m), weight 136.1 kg, last menstrual period 07/19/2024, SpO2 98%. General appearance: alert and cooperative Resp: clear to auscultation bilaterally Cardio: regular rate and rhythm, S1, S2 normal, no murmur, click, rub or gallop and regular rate and rhythm Extremities: Homans sign is negative, no sign of DVT  Disposition: Discharge disposition: 01-Home or Self Care        Allergies as of 08/07/2024       Reactions   Lo Loestrin Fe [norethin Ace-eth Estrad-fe] Other (See Comments)   PE secondary to blood clot        Medication List     STOP taking these medications    FeroSul 325 (65 Fe) MG tablet Generic drug: ferrous sulfate        TAKE these medications    acetaminophen  500 MG tablet Commonly known as: TYLENOL  Take 2 tablets (1,000 mg total) by mouth every 6 (six) hours. What changed:  when to take this reasons to take this   apixaban  5 MG Tabs tablet Commonly known as: Eliquis  Take 1 tablet (5 mg total) by mouth 2 (two) times daily.   buPROPion  100 MG tablet Commonly known as: WELLBUTRIN  Take 100 mg by mouth daily at 12 noon.   busPIRone  10 MG tablet Commonly known as: BUSPAR  Take 10  mg by mouth daily at 12 noon.   clonazePAM  0.5 MG tablet Commonly known as: KLONOPIN  Take 0.5 mg by mouth daily as needed (severe anxiety).   cyanocobalamin  1000 MCG tablet Commonly known as: VITAMIN B12 Take 1 tablet (1,000 mcg total) by mouth daily. What changed: when to take this   escitalopram  20 MG tablet Commonly known as: LEXAPRO  Take 20 mg by mouth daily at 12 noon.   folic acid  1 MG tablet Commonly known as: FOLVITE  Take 1 tablet (1 mg total) by mouth daily. What changed: when to take this   ondansetron  4 MG disintegrating tablet Commonly known as: ZOFRAN -ODT Take 1 tablet (4 mg total) by mouth every 8 (eight) hours as needed for nausea or vomiting.   oxyCODONE  5 MG immediate release tablet Commonly known as: Oxy IR/ROXICODONE  Take 1-2 tablets (5-10 mg total) by mouth every 4 (four) hours as needed for moderate pain (pain score 4-6).   PREVACID  24HR PO Take 30 mg by mouth daily. OTC        Follow-up Information     Armond Cape, MD Follow up on 08/14/2024.   Specialty: Obstetrics and Gynecology Why: TH at 2:15 Contact information: [address] [place] [address] [phone]                Start elliquis tomorrow Signed: Cape DELENA Armond 08/07/2024, 12:54 PM

## 2024-08-08 LAB — TYPE AND SCREEN
ABO/RH(D): A POS
Antibody Screen: NEGATIVE
Unit division: 0
Unit division: 0

## 2024-08-08 LAB — BPAM RBC
Blood Product Expiration Date: 202602152359
Blood Product Expiration Date: 202602152359
Unit Type and Rh: 6200
Unit Type and Rh: 6200

## 2024-08-08 LAB — SURGICAL PATHOLOGY

## 2024-08-15 ENCOUNTER — Other Ambulatory Visit: Payer: Self-pay | Admitting: Family Medicine

## 2024-08-15 DIAGNOSIS — I2699 Other pulmonary embolism without acute cor pulmonale: Secondary | ICD-10-CM

## 2024-08-16 ENCOUNTER — Encounter (HOSPITAL_BASED_OUTPATIENT_CLINIC_OR_DEPARTMENT_OTHER): Payer: Self-pay

## 2024-08-16 DIAGNOSIS — G4734 Idiopathic sleep related nonobstructive alveolar hypoventilation: Secondary | ICD-10-CM

## 2024-12-26 ENCOUNTER — Encounter: Admitting: Family Medicine
